# Patient Record
Sex: Female | Born: 1995 | Race: Black or African American | Hispanic: No | Marital: Married | Smoking: Current every day smoker
Health system: Southern US, Community
[De-identification: ages and names within clinical notes are randomized; demographics above are authoritative.]

## PROBLEM LIST (undated history)

## (undated) DIAGNOSIS — J40 Bronchitis, not specified as acute or chronic: Secondary | ICD-10-CM

## (undated) DIAGNOSIS — J45909 Unspecified asthma, uncomplicated: Secondary | ICD-10-CM

## (undated) DIAGNOSIS — L309 Dermatitis, unspecified: Secondary | ICD-10-CM

---
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
## 2016-03-24 ENCOUNTER — Emergency Department (HOSPITAL_COMMUNITY): Payer: Commercial Managed Care - HMO

## 2016-03-24 ENCOUNTER — Encounter (HOSPITAL_COMMUNITY): Payer: Self-pay

## 2016-03-24 ENCOUNTER — Emergency Department (HOSPITAL_COMMUNITY)
Admission: EM | Admit: 2016-03-24 | Discharge: 2016-03-24 | Disposition: A | Discharge status: Home / Self Care | Payer: Commercial Managed Care - HMO | Attending: Emergency Medicine | Admitting: Emergency Medicine

## 2016-03-24 DIAGNOSIS — R000 Tachycardia, unspecified: Secondary | ICD-10-CM | POA: Insufficient documentation

## 2016-03-24 DIAGNOSIS — R112 Nausea with vomiting, unspecified: Secondary | ICD-10-CM | POA: Diagnosis not present

## 2016-03-24 DIAGNOSIS — Z3202 Encounter for pregnancy test, result negative: Secondary | ICD-10-CM | POA: Insufficient documentation

## 2016-03-24 DIAGNOSIS — J45901 Unspecified asthma with (acute) exacerbation: Secondary | ICD-10-CM | POA: Insufficient documentation

## 2016-03-24 DIAGNOSIS — F17200 Nicotine dependence, unspecified, uncomplicated: Secondary | ICD-10-CM | POA: Diagnosis not present

## 2016-03-24 DIAGNOSIS — J159 Unspecified bacterial pneumonia: Secondary | ICD-10-CM | POA: Diagnosis not present

## 2016-03-24 DIAGNOSIS — Z79899 Other long term (current) drug therapy: Secondary | ICD-10-CM | POA: Diagnosis not present

## 2016-03-24 DIAGNOSIS — J189 Pneumonia, unspecified organism: Secondary | ICD-10-CM

## 2016-03-24 DIAGNOSIS — R05 Cough: Secondary | ICD-10-CM | POA: Diagnosis present

## 2016-03-24 DIAGNOSIS — R0602 Shortness of breath: Secondary | ICD-10-CM

## 2016-03-24 LAB — COMPREHENSIVE METABOLIC PANEL
ALT: 14 U/L (ref 14–54)
AST: 17 U/L (ref 15–41)
Albumin: 3.3 g/dL — ABNORMAL LOW (ref 3.5–5.0)
Alkaline Phosphatase: 82 U/L (ref 38–126)
Anion gap: 7 (ref 5–15)
BUN: <5 mg/dL — ABNORMAL LOW (ref 6–20)
CHLORIDE: 105 mmol/L (ref 101–111)
CO2: 25 mmol/L (ref 22–32)
Calcium: 9.3 mg/dL (ref 8.9–10.3)
Creatinine, Ser: 0.73 mg/dL (ref 0.44–1.00)
Glucose, Bld: 90 mg/dL (ref 65–99)
POTASSIUM: 3.5 mmol/L (ref 3.5–5.1)
Sodium: 137 mmol/L (ref 135–145)
Total Bilirubin: 0.1 mg/dL — ABNORMAL LOW (ref 0.3–1.2)
Total Protein: 8 g/dL (ref 6.5–8.1)

## 2016-03-24 LAB — URINALYSIS, ROUTINE W REFLEX MICROSCOPIC
Bilirubin Urine: NEGATIVE
GLUCOSE, UA: NEGATIVE mg/dL
Ketones, ur: NEGATIVE mg/dL
LEUKOCYTES UA: NEGATIVE
Nitrite: NEGATIVE
PH: 6 (ref 5.0–8.0)
PROTEIN: NEGATIVE mg/dL
Specific Gravity, Urine: 1.019 (ref 1.005–1.030)

## 2016-03-24 LAB — CBC
HEMATOCRIT: 33.8 % — AB (ref 36.0–46.0)
Hemoglobin: 9.3 g/dL — ABNORMAL LOW (ref 12.0–15.0)
MCH: 17.9 pg — ABNORMAL LOW (ref 26.0–34.0)
MCHC: 27.5 g/dL — ABNORMAL LOW (ref 30.0–36.0)
MCV: 64.9 fL — AB (ref 78.0–100.0)
Platelets: 443 10*3/uL — ABNORMAL HIGH (ref 150–400)
RBC: 5.21 MIL/uL — AB (ref 3.87–5.11)
RDW: 19.7 % — ABNORMAL HIGH (ref 11.5–15.5)
WBC: 11.8 10*3/uL — AB (ref 4.0–10.5)

## 2016-03-24 LAB — URINE MICROSCOPIC-ADD ON

## 2016-03-24 LAB — LIPASE, BLOOD: LIPASE: 18 U/L (ref 11–51)

## 2016-03-24 LAB — I-STAT BETA HCG BLOOD, ED (MC, WL, AP ONLY): I-stat hCG, quantitative: <5 m[IU]/mL (ref <5)

## 2016-03-24 MED ORDER — AZITHROMYCIN 250 MG PO TABS: 250.0000 mg | ORAL_TABLET | Freq: Every day | ORAL | Status: DC

## 2016-03-24 MED ORDER — ONDANSETRON 4 MG PO TBDP
4.0000 mg | ORAL_TABLET | Freq: Once | ORAL | Status: AC
  Administered 2016-03-24: 4 mg via ORAL
  Filled 2016-03-24: qty 1

## 2016-03-24 MED ORDER — ONDANSETRON 4 MG PO TBDP: 4.0000 mg | ORAL_TABLET | Freq: Three times a day (TID) | ORAL | Status: DC | PRN

## 2016-03-24 MED ORDER — LIDOCAINE HCL (PF) 1 % IJ SOLN
INTRAMUSCULAR | Status: AC
  Filled 2016-03-24: qty 5

## 2016-03-24 MED ORDER — DEXTROSE 5 % IV SOLN
1.0000 g | Freq: Once | INTRAVENOUS | Status: DC
  Filled 2016-03-24: qty 10

## 2016-03-24 MED ORDER — ONDANSETRON HCL 4 MG/2ML IJ SOLN
4.0000 mg | Freq: Once | INTRAMUSCULAR | Status: DC
  Filled 2016-03-24: qty 2

## 2016-03-24 MED ORDER — CEFTRIAXONE SODIUM 1 G IJ SOLR
1.0000 g | Freq: Once | INTRAMUSCULAR | Status: AC
  Administered 2016-03-24: 1 g via INTRAMUSCULAR
  Filled 2016-03-24: qty 10

## 2016-03-24 MED ORDER — SODIUM CHLORIDE 0.9 % IV BOLUS (SEPSIS): 1000.0000 mL | Freq: Once | INTRAVENOUS | Status: DC

## 2016-03-24 NOTE — ED Notes (Addendum)
Patient here with increased cough and using inhaler more frequently with body aches x 2 days.  Vomited x 2 following cough. No distress. Also complains of right lower abdominal pain

## 2016-03-24 NOTE — ED Notes (Signed)
IV Team at bedside 

## 2016-03-24 NOTE — ED Notes (Signed)
IV Team unsuccessful at gaining access. Reported this to PA, who agreed to change orders.

## 2016-03-24 NOTE — Discharge Instructions (Signed)
Community-Acquired Pneumonia, Adult Pneumonia is an infection of the lungs. One type of pneumonia can happen while a person is in a hospital. A different type can happen when a person is not in a hospital (community-acquired pneumonia). It is easy for this kind to spread from person to person. It can spread to you if you breathe near an infected person who coughs or sneezes. Some symptoms include:  A dry cough.  A wet (productive) cough.  Fever.  Sweating.  Chest pain. HOME CARE  Take over-the-counter and prescription medicines only as told by your doctor.  Only take cough medicine if you are losing sleep.  If you were prescribed an antibiotic medicine, take it as told by your doctor. Do not stop taking the antibiotic even if you start to feel better.  Sleep with your head and neck raised (elevated). You can do this by putting a few pillows under your head, or you can sleep in a recliner.  Do not use tobacco products. These include cigarettes, chewing tobacco, and e-cigarettes. If you need help quitting, ask your doctor.  Drink enough water to keep your pee (urine) clear or pale yellow. A shot (vaccine) can help prevent pneumonia. Shots are often suggested for:  People older than 19 years of age.  People older than 19 years of age:  Who are having cancer treatment.  Who have long-term (chronic) lung disease.  Who have problems with their body's defense system (immune system). You may also prevent pneumonia if you take these actions:  Get the flu (influenza) shot every year.  Go to the dentist as often as told.  Wash your hands often. If soap and water are not available, use hand sanitizer. GET HELP IF:  You have a fever.  You lose sleep because your cough medicine does not help. GET HELP RIGHT AWAY IF:  You are short of breath and it gets worse.  You have more chest pain.  Your sickness gets worse. This is very serious if:  You are an older adult.  Your  body's defense system is weak.  You cough up blood.   This information is not intended to replace advice given to you by your health care provider. Make sure you discuss any questions you have with your health care provider.   Document Released: 03/28/2008 Document Revised: 07/01/2015 Document Reviewed: 02/04/2015 Elsevier Interactive Patient Education 2016 [employer] Use your inhaler as needed for the next several days than per routine  Make sure to take all the antibiotic

## 2016-03-24 NOTE — ED Notes (Signed)
Pt departed in NAD.  

## 2016-03-24 NOTE — ED Notes (Signed)
Gave pt water and graham crackers for PO challenge.

## 2016-03-24 NOTE — ED Provider Notes (Signed)
CSN: [id number]     Arrival date & time 03/24/16  1811 History   First MD Initiated Contact with Patient 03/24/16 2011     Chief Complaint  Patient presents with  . Cough  . Generalized Body Aches     (Consider location/radiation/quality/duration/timing/severity/associated sxs/prior Treatment) HPI Comments: This a morbidly obese African-American female with a history of intermittent asthma.  He states she hasn't used her inhaler in several months.  Yesterday.  Notice she was feeling short of breath and having "hot flashes".  She's been using her inhaler with little relief.  She also reports today having multiple episodes of vomiting.  Patient is a 20 y.o. female presenting with cough. The history is provided by the patient.  Cough Cough characteristics:  Non-productive and hacking Severity:  Moderate Onset quality:  Gradual Duration:  2 days Timing:  Intermittent Progression:  Worsening Chronicity:  New Smoker: yes   Context: not upper respiratory infection   Relieved by:  Nothing Worsened by:  Activity Ineffective treatments:  Beta-agonist inhaler Associated symptoms: fever, myalgias, shortness of breath and wheezing   Associated symptoms: no chest pain, no chills, no eye discharge and no rhinorrhea   Associated symptoms comment:  Nausea and vomiting Fever:    Timing:  Unable to specify   Temp source:  Subjective   Progression:  Unable to specify Myalgias:    Location:  Generalized Shortness of breath:    Severity:  Mild Wheezing:    Severity:  Mild   Timing:  Intermittent   Chronicity:  Recurrent   Past Medical History  Diagnosis Date  . Asthma    History reviewed. No pertinent past surgical history. Family History  Problem Relation Age of Onset  . Cancer Mother   . Diabetes Other    Social History  Substance Use Topics  . Smoking status: Current Every Day Smoker -- 0.25 packs/day  . Smokeless tobacco: Current User  . Alcohol Use: Yes     Comment: rarely    OB History    No data available     Review of Systems  Constitutional: Positive for fever. Negative for chills.  HENT: Negative for rhinorrhea.   Eyes: Negative for discharge.  Respiratory: Positive for cough, shortness of breath and wheezing.   Cardiovascular: Negative for chest pain.  Gastrointestinal: Positive for nausea and vomiting. Negative for abdominal pain, diarrhea and constipation.  Genitourinary: Negative for dysuria, frequency and vaginal discharge.  Musculoskeletal: Positive for myalgias.  All other systems reviewed and are negative.     Allergies  Review of patient's allergies indicates no known allergies.  Home Medications   Prior to Admission medications   Medication Sig Start Date End Date Taking? Authorizing Provider  albuterol (PROVENTIL HFA;VENTOLIN HFA) 108 (90 Base) MCG/ACT inhaler Inhale 1-2 puffs into the lungs every 6 (six) hours as needed for wheezing or shortness of breath. 11/17/15  Yes Sanjuana Kava, NP  azithromycin (ZITHROMAX Z-PAK) 250 MG tablet Take 1 tablet (250 mg total) by mouth daily. 03/24/16   Earley Favor, NP  docusate sodium (COLACE) 100 MG capsule Take 1 capsule (100 mg total) by mouth daily. (May purchase from over the counter at yr pharmacy): For constipation Patient not taking: Reported on 03/24/2016 11/17/15   Sanjuana Kava, NP  ferrous sulfate 325 (65 FE) MG tablet Take 1 tablet (325 mg total) by mouth 2 (two) times daily with a meal. For low Iron Patient not taking: Reported on 03/24/2016 11/17/15   Sanjuana Kava, NP  nicotine polacrilex (NICORETTE) 2 MG gum Take 1 each (2 mg total) by mouth as needed for smoking cessation. Patient not taking: Reported on 03/24/2016 11/17/15   Sanjuana KavaAgnes I Nwoko, NP  ondansetron (ZOFRAN ODT) 4 MG disintegrating tablet Take 1 tablet (4 mg total) by mouth every 8 (eight) hours as needed for nausea or vomiting. 03/24/16   Earley FavorGail Dupree Givler, NP   BP 152/70 mmHg  Pulse 129  Temp(Src) 99.5 F (37.5 C) (Oral)  Resp 22  Ht  5\' 4"  (1.626 m)  Wt 132.45 kg  BMI 50.10 kg/m2  SpO2 97%  LMP 03/17/2016 Physical Exam  Constitutional: She appears well-developed and well-nourished.  HENT:  Head: Normocephalic.  Mouth/Throat: Oropharynx is clear and moist.  Eyes: Pupils are equal, round, and reactive to light.  Neck: Normal range of motion.  Cardiovascular: Regular rhythm.  Tachycardia present.   Pulmonary/Chest: Effort normal. No respiratory distress. She has wheezes.  Abdominal: Soft. She exhibits no distension. There is no tenderness.  Musculoskeletal: Normal range of motion.  Neurological: She is alert.  Skin: Skin is warm.  Nursing note and vitals reviewed.   ED Course  Procedures (including critical care time) Labs Review Labs Reviewed  COMPREHENSIVE METABOLIC PANEL - Abnormal; Notable for the following:    BUN <5 (*)    Albumin 3.3 (*)    Total Bilirubin 0.1 (*)    All other components within normal limits  CBC - Abnormal; Notable for the following:    WBC 11.8 (*)    RBC 5.21 (*)    Hemoglobin 9.3 (*)    HCT 33.8 (*)    MCV 64.9 (*)    MCH 17.9 (*)    MCHC 27.5 (*)    RDW 19.7 (*)    Platelets 443 (*)    All other components within normal limits  URINALYSIS, ROUTINE W REFLEX MICROSCOPIC ([facility]) - Abnormal; Notable for the following:    Hgb urine dipstick MODERATE (*)    All other components within normal limits  URINE MICROSCOPIC-ADD ON - Abnormal; Notable for the following:    Squamous Epithelial / LPF 0-5 (*)    Bacteria, UA RARE (*)    All other components within normal limits  LIPASE, BLOOD  I-STAT BETA HCG BLOOD, ED ([facility], [facility], [facility] ONLY)    Imaging Review Dg Chest 2 View  03/24/2016  CLINICAL DATA:  Acute onset of productive cough, chest tightness and body aches. Headache and vomiting. Fever. Initial encounter. EXAM: CHEST  2 VIEW COMPARISON:  Chest radiograph performed 04/14/2015 FINDINGS: The lungs are well-aerated. Pulmonary vascularity is at the upper limits of normal.  Minimal left basilar opacity may reflect pneumonia, given the patient's symptoms. There is no evidence of pleural effusion or pneumothorax. The heart is normal in size; the mediastinal contour is within normal limits. No acute osseous abnormalities are seen. IMPRESSION: Minimal left basilar opacity may reflect pneumonia, given the patient's symptoms. Electronically Signed   By: Roanna RaiderJeffery  Chang M.D.   On: 03/24/2016 19:04   I have personally reviewed and evaluated these images and lab results as part of my medical decision-making.   EKG Interpretation None     Patient has been given IM Rocephin and ODT Zofran.  She is now tolerating fluids.  She's not having any more wheezing.  Chest x-ray shows she has a vague opacity.  She's been instructed to take azithromycin of 80 mg on day 1, 2 days 2 through 5, and follow-up with her primary care physician  MDM   Final diagnoses:  CAP (community acquired pneumonia)  Non-intractable vomiting with nausea, vomiting of unspecified type  Shortness of breath         Earley Favor, NP 03/24/16 2249  Gerhard Munch, MD 03/24/16 2337

## 2016-03-24 NOTE — ED Notes (Signed)
Unable to void at triage

## 2016-06-03 IMAGING — CR DG CHEST 2V
2 series · 2 of 2 positions shown · non-contrast
Comparison: December 09, 2013

CLINICAL DATA: Cough and nasal congestion; shortness of breath

EXAM:
CHEST  2 VIEW

[chest pa]
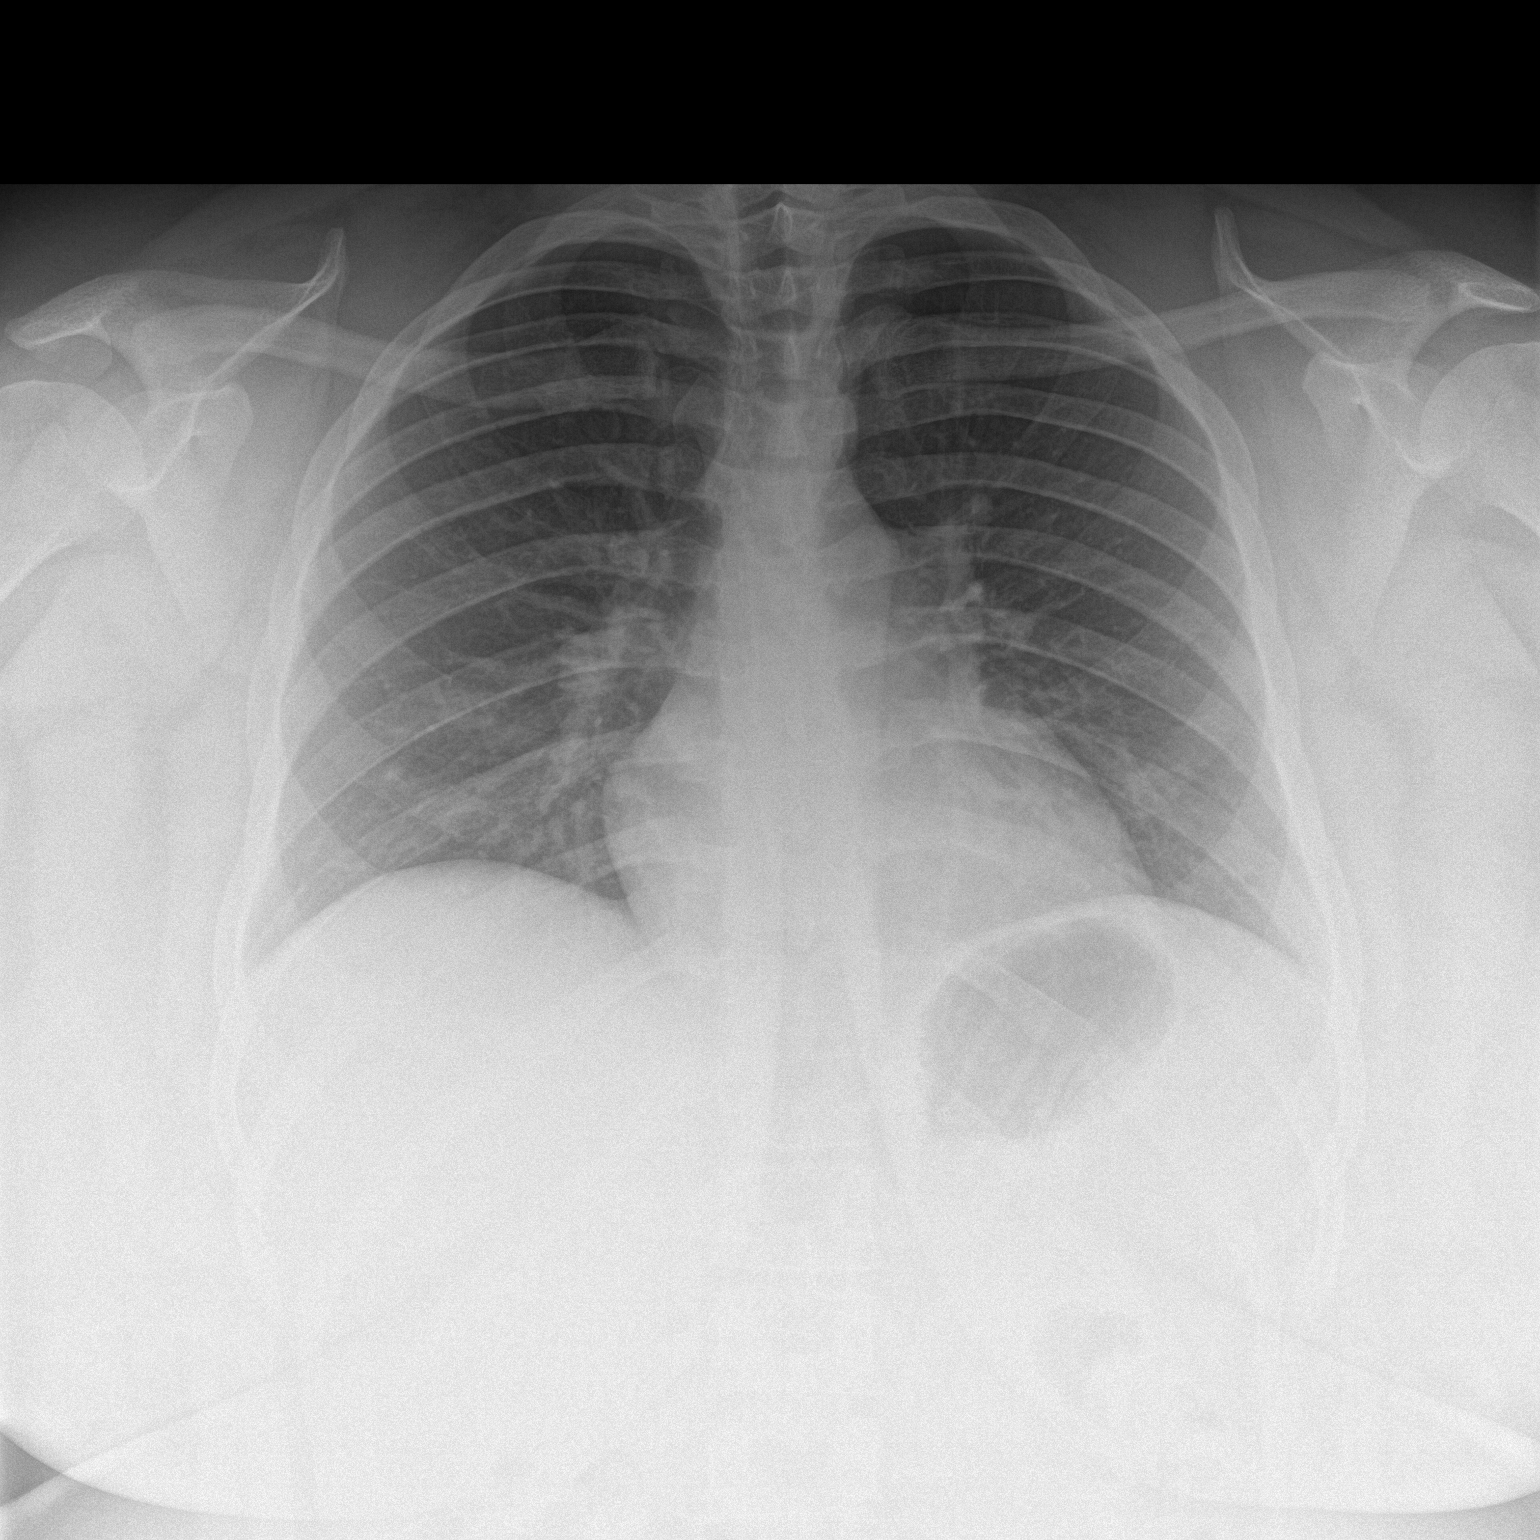

[chest lat]
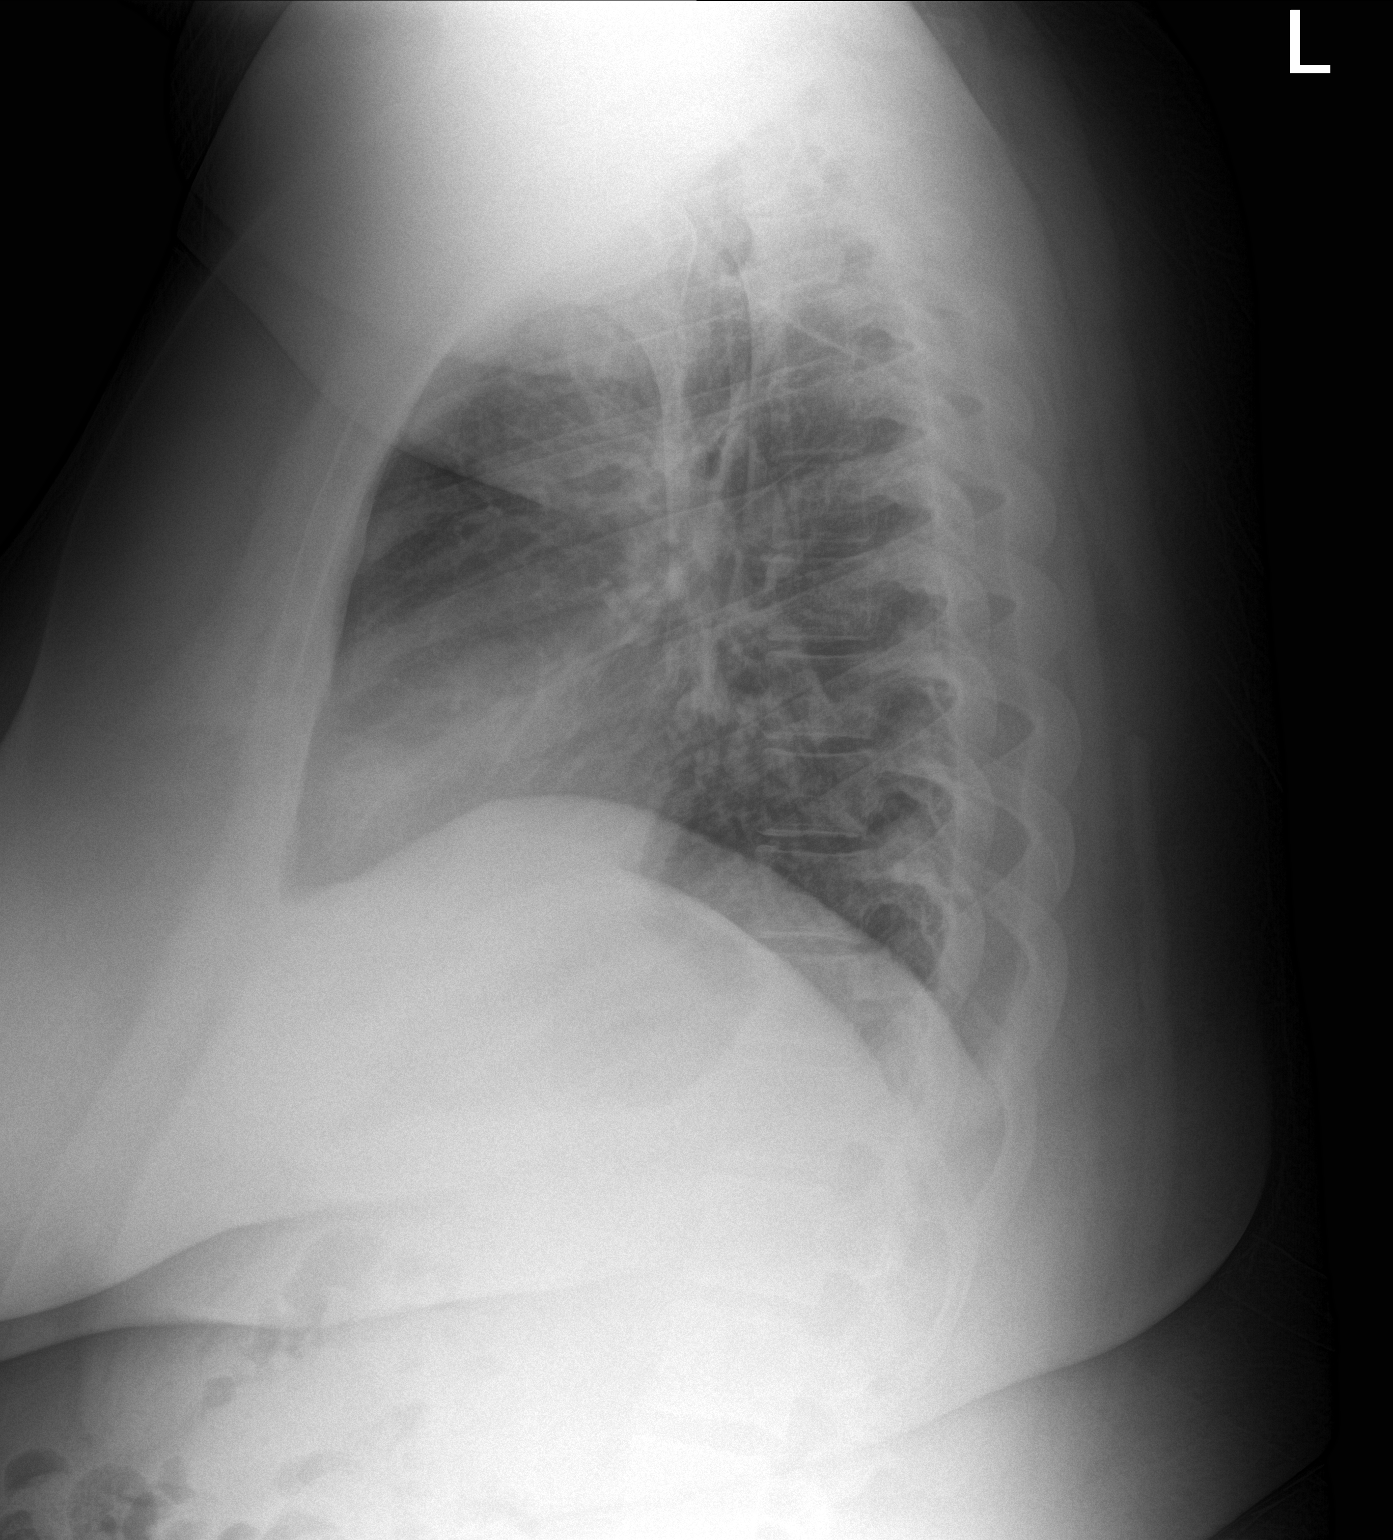

[2 of 2 positions shown; findings below may reference images not displayed]

FINDINGS: Lungs are clear. The heart size and pulmonary vascularity are
normal. No adenopathy. No bone lesions.
IMPRESSION: No edema or consolidation.

## 2016-07-03 ENCOUNTER — Ambulatory Visit (HOSPITAL_COMMUNITY)
Admission: EM | Admit: 2016-07-03 | Discharge: 2016-07-03 | Disposition: A | Discharge status: Home / Self Care | Payer: Commercial Managed Care - HMO | Attending: Family Medicine | Admitting: Family Medicine

## 2016-07-03 ENCOUNTER — Encounter (HOSPITAL_COMMUNITY): Payer: Self-pay | Admitting: Emergency Medicine

## 2016-07-03 DIAGNOSIS — R0982 Postnasal drip: Secondary | ICD-10-CM

## 2016-07-03 DIAGNOSIS — J4530 Mild persistent asthma, uncomplicated: Secondary | ICD-10-CM | POA: Diagnosis not present

## 2016-07-03 DIAGNOSIS — J9801 Acute bronchospasm: Secondary | ICD-10-CM | POA: Diagnosis not present

## 2016-07-03 HISTORY — DX: Bronchitis, not specified as acute or chronic: J40

## 2016-07-03 MED ORDER — DEXAMETHASONE SODIUM PHOSPHATE 10 MG/ML IJ SOLN
10.0000 mg | Freq: Once | INTRAMUSCULAR | Status: AC
  Administered 2016-07-03: 10 mg via INTRAMUSCULAR

## 2016-07-03 MED ORDER — SODIUM CHLORIDE 0.9 % IN NEBU
INHALATION_SOLUTION | RESPIRATORY_TRACT | Status: AC
  Filled 2016-07-03: qty 3

## 2016-07-03 MED ORDER — DEXAMETHASONE SODIUM PHOSPHATE 10 MG/ML IJ SOLN
INTRAMUSCULAR | Status: AC
  Filled 2016-07-03: qty 1

## 2016-07-03 MED ORDER — METHYLPREDNISOLONE ACETATE 80 MG/ML IJ SUSP
80.0000 mg | Freq: Once | INTRAMUSCULAR | Status: AC
  Administered 2016-07-03: 80 mg via INTRAMUSCULAR

## 2016-07-03 MED ORDER — IPRATROPIUM-ALBUTEROL 0.5-2.5 (3) MG/3ML IN SOLN
RESPIRATORY_TRACT | Status: AC
  Filled 2016-07-03: qty 3

## 2016-07-03 MED ORDER — PREDNISONE 20 MG PO TABS: ORAL_TABLET | ORAL | 0 refills | Status: DC

## 2016-07-03 MED ORDER — ALBUTEROL SULFATE HFA 108 (90 BASE) MCG/ACT IN AERS: 2.0000 | INHALATION_SPRAY | RESPIRATORY_TRACT | 0 refills | Status: DC | PRN

## 2016-07-03 MED ORDER — IPRATROPIUM-ALBUTEROL 0.5-2.5 (3) MG/3ML IN SOLN
3.0000 mL | Freq: Once | RESPIRATORY_TRACT | Status: AC
  Administered 2016-07-03: 3 mL via RESPIRATORY_TRACT

## 2016-07-03 MED ORDER — METHYLPREDNISOLONE ACETATE 80 MG/ML IJ SUSP
INTRAMUSCULAR | Status: AC
  Filled 2016-07-03: qty 1

## 2016-07-03 NOTE — ED Notes (Signed)
Treatment completed.

## 2016-07-03 NOTE — ED Triage Notes (Signed)
Patient reports a 3 day history of symptoms: productive cough-brown phlegm.  Chest soreness with cough, sore throat, throat drainage, states " feels like a sinus infection and bronchitis".  Patient reports general body aches.   Cough is worse at night.     Patient reports her boss sent her here for evaluation.  Patient will need work note.

## 2016-07-03 NOTE — ED Provider Notes (Signed)
CSN: [id number]     Arrival date & time 07/03/16  1520 History   First MD Initiated Contact with Patient 07/03/16 1654     Chief Complaint  Patient presents with  . URI   (Consider location/radiation/quality/duration/timing/severity/associated sxs/prior Treatment) 20 year old female 292 pounds with a history of asthma presents to the urgent care with complaints of sneezing, coughing, wheezing, sore throat, body aches.      Past Medical History:  Diagnosis Date  . Asthma   . Bronchitis    History reviewed. No pertinent surgical history. Family History  Problem Relation Age of Onset  . Cancer Mother   . Diabetes Other    Social History  Substance Use Topics  . Smoking status: Current Every Day Smoker    Packs/day: 0.25  . Smokeless tobacco: Current User  . Alcohol use Yes     Comment: rarely   OB History    No data available     Review of Systems  Constitutional: Negative for activity change, appetite change, chills and fever.  HENT: Positive for congestion, postnasal drip, rhinorrhea and sore throat. Negative for ear pain and facial swelling.   Eyes: Negative.   Respiratory: Positive for cough, shortness of breath and wheezing.   Cardiovascular: Negative.   Gastrointestinal: Negative.   Musculoskeletal: Negative for neck pain and neck stiffness.  Skin: Negative for pallor and rash.  Neurological: Negative.   All other systems reviewed and are negative.   Allergies  Review of patient's allergies indicates no known allergies.  Home Medications   Prior to Admission medications   Medication Sig Start Date End Date Taking? Authorizing Provider  Fexofenadine HCl (MUCINEX ALLERGY PO) Take by mouth.   Yes Historical Provider, MD  guaifenesin (ROBITUSSIN) 100 MG/5ML syrup Take 200 mg by mouth 3 (three) times daily as needed for cough.   Yes Historical Provider, MD  albuterol (PROVENTIL HFA;VENTOLIN HFA) 108 (90 Base) MCG/ACT inhaler Inhale 2 puffs into the lungs every  4 (four) hours as needed for wheezing or shortness of breath. 07/03/16   Hayden Rasmussenavid Marialy Urbanczyk, NP  azithromycin (ZITHROMAX Z-PAK) 250 MG tablet Take 1 tablet (250 mg total) by mouth daily. Patient not taking: Reported on 07/03/2016 03/24/16   Earley FavorGail Schulz, NP  docusate sodium (COLACE) 100 MG capsule Take 1 capsule (100 mg total) by mouth daily. (May purchase from over the counter at yr pharmacy): For constipation Patient not taking: Reported on 03/24/2016 11/17/15   Sanjuana KavaAgnes I Nwoko, NP  ferrous sulfate 325 (65 FE) MG tablet Take 1 tablet (325 mg total) by mouth 2 (two) times daily with a meal. For low Iron Patient not taking: Reported on 03/24/2016 11/17/15   Sanjuana KavaAgnes I Nwoko, NP  nicotine polacrilex (NICORETTE) 2 MG gum Take 1 each (2 mg total) by mouth as needed for smoking cessation. Patient not taking: Reported on 03/24/2016 11/17/15   Sanjuana KavaAgnes I Nwoko, NP  ondansetron (ZOFRAN ODT) 4 MG disintegrating tablet Take 1 tablet (4 mg total) by mouth every 8 (eight) hours as needed for nausea or vomiting. 03/24/16   Earley FavorGail Schulz, NP  predniSONE (DELTASONE) 20 MG tablet 3 Tabs PO Days 1-3, then 2 tabs PO Days 4-6, then 1 tab PO Day 7-9, then Half Tab PO Day 10-12. Start 07/04/16 07/03/16   Hayden Rasmussenavid Elkin Belfield, NP   Meds Ordered and Administered this Visit   Medications  ipratropium-albuterol (DUONEB) 0.5-2.5 (3) MG/3ML nebulizer solution 3 mL (3 mLs Nebulization Given 07/03/16 1735)  dexamethasone (DECADRON) injection 10 mg (10 mg Intramuscular  Given 07/03/16 1735)  methylPREDNISolone acetate (DEPO-MEDROL) injection 80 mg (80 mg Intramuscular Given 07/03/16 1735)    BP (!) 129/101 (BP Location: Right Wrist)   Pulse 105   Temp 98 F (36.7 C) (Oral)   Resp 18   LMP 06/25/2016 (Exact Date)   SpO2 99%  No data found.   Physical Exam  Constitutional: She is oriented to person, place, and time. She appears well-developed and well-nourished. No distress.  Morbidly obese 20 year old female sitting on the exam table in no acute distress.  Speaking in complete sentences.  HENT:  Head: Normocephalic and atraumatic.  Mouth/Throat: No oropharyngeal exudate.  Bilateral TMs are normal. Oropharynx with minor erythema, cobblestoning and moderate amount of clear PND.  Neck: Normal range of motion. Neck supple.  Cardiovascular: Normal rate, regular rhythm and normal heart sounds.   Pulmonary/Chest: Effort normal. No respiratory distress. She has wheezes. She exhibits tenderness.  Musculoskeletal: Normal range of motion. She exhibits no edema.  Lymphadenopathy:    She has no cervical adenopathy.  Neurological: She is alert and oriented to person, place, and time.  Skin: Skin is warm and dry. No rash noted.  Psychiatric: She has a normal mood and affect.  Nursing note and vitals reviewed.   Urgent Care Course   Clinical Course    Procedures (including critical care time)  Labs Review Labs Reviewed - No data to display  Imaging Review No results found.   Visual Acuity Review  Right Eye Distance:   Left Eye Distance:   Bilateral Distance:    Right Eye Near:   Left Eye Near:    Bilateral Near:         MDM   1. Cough due to bronchospasm   2. PND (post-nasal drip)   3. Asthma, mild persistent, uncomplicated    Post DuoNeb patient states she is breathing better. Auscultation reveals improved air movement, abatement of wheezes and no other adventitious sounds. Use the albuterol HFA inhaler 2 puffs every 4 hours as needed for cough and wheeze Start taking the prednisone taper medications tomorrow. Stop smoking. Recommend taking an antihistamine daily such as Allegra, Claritin or Zyrtec. Drink plenty of fluids stay well-hydrated. Call to obtain a primary care provider. Meds ordered this encounter  Medications  . Fexofenadine HCl (MUCINEX ALLERGY PO)    Sig: Take by mouth.  . guaifenesin (ROBITUSSIN) 100 MG/5ML syrup    Sig: Take 200 mg by mouth 3 (three) times daily as needed for cough.  Marland Kitchen  ipratropium-albuterol (DUONEB) 0.5-2.5 (3) MG/3ML nebulizer solution 3 mL  . dexamethasone (DECADRON) injection 10 mg  . methylPREDNISolone acetate (DEPO-MEDROL) injection 80 mg  . albuterol (PROVENTIL HFA;VENTOLIN HFA) 108 (90 Base) MCG/ACT inhaler    Sig: Inhale 2 puffs into the lungs every 4 (four) hours as needed for wheezing or shortness of breath.    Dispense:  1 Inhaler    Refill:  0    Order Specific Question:   Supervising Provider    Answer:   Elvina Sidle [[id number]]  . predniSONE (DELTASONE) 20 MG tablet    Sig: 3 Tabs PO Days 1-3, then 2 tabs PO Days 4-6, then 1 tab PO Day 7-9, then Half Tab PO Day 10-12. Start 07/04/16    Dispense:  20 tablet    Refill:  0    Order Specific Question:   Supervising Provider    Answer:   Maudry Diego, NP 07/03/16 1810

## 2016-07-03 NOTE — Discharge Instructions (Signed)
Use the albuterol HFA inhaler 2 puffs every 4 hours as needed for cough and wheeze Start taking the prednisone taper medications tomorrow. Stop smoking. Recommend taking an antihistamine daily such as Allegra, Claritin or Zyrtec. Drink plenty of fluids stay well-hydrated. Call to obtain a primary care provider.

## 2017-01-01 ENCOUNTER — Encounter (HOSPITAL_COMMUNITY): Payer: Self-pay

## 2017-01-01 ENCOUNTER — Emergency Department (HOSPITAL_COMMUNITY)
Admission: EM | Admit: 2017-01-01 | Discharge: 2017-01-01 | Disposition: A | Discharge status: Home / Self Care | Payer: Commercial Managed Care - HMO | Attending: Physician Assistant | Admitting: Physician Assistant

## 2017-01-01 ENCOUNTER — Emergency Department (HOSPITAL_COMMUNITY): Payer: Commercial Managed Care - HMO

## 2017-01-01 DIAGNOSIS — J45909 Unspecified asthma, uncomplicated: Secondary | ICD-10-CM | POA: Diagnosis not present

## 2017-01-01 DIAGNOSIS — Y9389 Activity, other specified: Secondary | ICD-10-CM | POA: Diagnosis not present

## 2017-01-01 DIAGNOSIS — F17210 Nicotine dependence, cigarettes, uncomplicated: Secondary | ICD-10-CM | POA: Diagnosis not present

## 2017-01-01 DIAGNOSIS — S92415A Nondisplaced fracture of proximal phalanx of left great toe, initial encounter for closed fracture: Secondary | ICD-10-CM | POA: Diagnosis not present

## 2017-01-01 DIAGNOSIS — Y929 Unspecified place or not applicable: Secondary | ICD-10-CM | POA: Diagnosis not present

## 2017-01-01 DIAGNOSIS — W208XXA Other cause of strike by thrown, projected or falling object, initial encounter: Secondary | ICD-10-CM | POA: Diagnosis not present

## 2017-01-01 DIAGNOSIS — Y999 Unspecified external cause status: Secondary | ICD-10-CM | POA: Diagnosis not present

## 2017-01-01 DIAGNOSIS — S99922A Unspecified injury of left foot, initial encounter: Secondary | ICD-10-CM | POA: Diagnosis present

## 2017-01-01 MED ORDER — HYDROCODONE-ACETAMINOPHEN 5-325 MG PO TABS
1.0000 | ORAL_TABLET | Freq: Once | ORAL | Status: AC
  Administered 2017-01-01: 1 via ORAL
  Filled 2017-01-01: qty 1

## 2017-01-01 NOTE — ED Triage Notes (Signed)
Pt endorses working this evening when a tow bar fell on her left foot and injured her left big toe. Pt continued walking but is now unable to move left bit toe. CMS intact.

## 2017-01-01 NOTE — ED Provider Notes (Signed)
[facility]-EMERGENCY DEPT Provider Note   CSN: [id number] Arrival date & time: 01/01/17  2108   By signing my name below, I, Soijett Blue, attest that this documentation has been prepared under the direction and in the presence of Roxy Horsemanobert Sameul Tagle, PA-C Electronically Signed: Soijett Blue, ED Scribe. 01/01/17. 11:00 PM.  History   Chief Complaint Chief Complaint  Patient presents with  . Toe Injury    HPI Kimberly Marquez is a 21 y.o. female who presents to the Emergency Department complaining of left great toe injury occurring PTA. Pt reports associated gait problem due to pain. Pt has not tried any medications for the relief of her symptoms. 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 She denies color change, wound, and any other symptoms.    The history is provided by the patient. No language interpreter was used.    Past Medical History:  Diagnosis Date  . Asthma   . Bronchitis     Patient Active Problem List   Diagnosis Date Noted  . Overdose   . 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 No pertinent surgical history.  OB History    No data available       Home Medications    Prior to Admission medications   Medication Sig Start Date End Date Taking? Authorizing Provider  albuterol (PROVENTIL HFA;VENTOLIN HFA) 108 (90 Base) MCG/ACT inhaler Inhale 2 puffs into the lungs every 4 (four) hours as needed for wheezing or shortness of breath. 07/03/16   Hayden Rasmussenavid Mabe, NP  Fexofenadine HCl [facility](MUCINEX ALLERGY PO) Take by mouth.    Historical Provider, MD  guaifenesin (ROBITUSSIN) 100 MG/5ML syrup Take 200 mg by mouth 3 (three) times daily as needed for cough.    Historical Provider, MD  nicotine polacrilex (NICORETTE) 2 MG gum Take 1 each (2 mg total) by mouth as needed for smoking cessation. Patient not taking: Reported on 03/24/2016 11/17/15   Sanjuana KavaAgnes I Nwoko, NP    ondansetron (ZOFRAN ODT) 4 MG disintegrating tablet Take 1 tablet (4 mg total) by mouth every 8 (eight) hours as needed for nausea or vomiting. 03/24/16   Earley FavorGail Schulz, NP  predniSONE (DELTASONE) 20 MG tablet 3 Tabs PO Days 1-3, then 2 tabs PO Days 4-6, then 1 tab PO Day 7-9, then Half Tab PO Day 10-12. Start 07/04/16 07/03/16   Hayden Rasmussenavid Mabe, NP    Family History Family History  Problem Relation Age of Onset  . Cancer Mother   . Diabetes Other     Social History Social History  Substance Use Topics  . Smoking status: Current Every Day Smoker    Packs/day: 0.25    Types: Cigarettes  . Smokeless tobacco: Current User  . Alcohol use Yes     Comment: rarely     Allergies   Patient has no known allergies.   Review of Systems Review of Systems  Musculoskeletal: Positive for arthralgias (left great toe) and gait problem (due to pain).  Skin: Negative for color change and wound.     Physical Exam Updated Vital Signs BP 132/79 (BP Location: Right Arm)   Pulse 102   Temp 98.6 F (37 C) (Oral)   Resp 18   Ht 5\' 5"  (1.651 m)   Wt 295 lb (133.8 kg)   LMP 12/27/2016 (Exact Date) Comment: verified BEFORE imaging  SpO2 100%   BMI 49.09 kg/m  Physical Exam Nursing note and vitals reviewed.  Constitutional: Pt appears well-developed and well-nourished. No distress.  HENT:  Head: Normocephalic and atraumatic.  Eyes: Conjunctivae are normal.  Neck: Normal range of motion.  Cardiovascular: Normal rate, regular rhythm. Intact distal pulses.   Capillary refill < 3 sec.  Pulmonary/Chest: Effort normal and breath sounds normal.  Musculoskeletal:  LLE Pt exhibits TTP over distal 1st phalanx. No obvious bony abnormality or deformity.   ROM: 4/5  Strength: 4/5  Neurological: Pt  is alert. Coordination normal.  Sensation: 5/5 Skin: Skin is warm and dry. Pt is not diaphoretic.  No evidence of open wound or skin tenting Psychiatric: Pt has a normal mood and affect.    ED  Treatments / Results  DIAGNOSTIC STUDIES: Oxygen Saturation is 100% on RA, nl by my interpretation.    COORDINATION OF CARE: 10:58 PM Discussed treatment plan with pt at bedside which includes left foot xray, post-op shoe and pt agreed to plan.   Radiology Dg Foot Complete Left  Result Date: 01/01/2017 CLINICAL DATA:  A tow bar fell onto the left foot this evening, injuring the great toe. Now unable to move the great toe. Pain and bruising at the base of the toe nail. EXAM: LEFT FOOT - COMPLETE 3+ VIEW COMPARISON:  09/15/2009 FINDINGS: Moderate diffuse soft tissue swelling of the left foot. Linear lucency obliquely oriented along the base of the distal phalanx of the left first toe laterally with extension focally to the joint surface. No focal bone lesion or bone destruction. Joint spaces are preserved. Lisfranc joint appears intact. IMPRESSION: Soft tissue swelling. Nondisplaced fracture across the base of the distal phalanx of the left first toe laterally with probable extension to the joint surface. Electronically Signed   By: Burman Nieves M.D.   On: 01/01/2017 21:56    Procedures Procedures (including critical care time)  Medications Ordered in ED Medications - No data to display   Initial Impression / Assessment and Plan / ED Course  I have reviewed the triage vital signs and the nursing notes.  Pertinent imaging results that were available during my care of the patient were reviewed by me and considered in my medical decision making (see chart for details).      Patient X-Ray positive for "Nondisplaced fracture across the base of the distal phalanx of the left first toe laterally with probable extension to the joint surface." Pt advised to follow up with PCP/orthopedics. Patient given post-op shoe while in ED, conservative therapy recommended and discussed. Patient will be discharged home & is agreeable with above plan. Returns precautions discussed. Pt appears safe for  discharge.  Final Clinical Impressions(s) / ED Diagnoses   Final diagnoses:  Closed nondisplaced fracture of proximal phalanx of left great toe, initial encounter    New Prescriptions New Prescriptions   No medications on file   I personally performed the services described in this documentation, which was scribed in my presence. The recorded information has been reviewed and is accurate.       Roxy Horseman, PA-C 01/01/17 2346    Courteney Randall An, MD 01/06/17 1431

## 2017-05-14 IMAGING — CR DG CHEST 2V
2 series · 2 of 2 positions shown · non-contrast
Comparison: Chest radiograph performed 04/14/2015

CLINICAL DATA: Acute onset of productive cough, chest tightness and
body aches. Headache and vomiting. Fever. Initial encounter.

EXAM:
CHEST  2 VIEW

[chest pa]
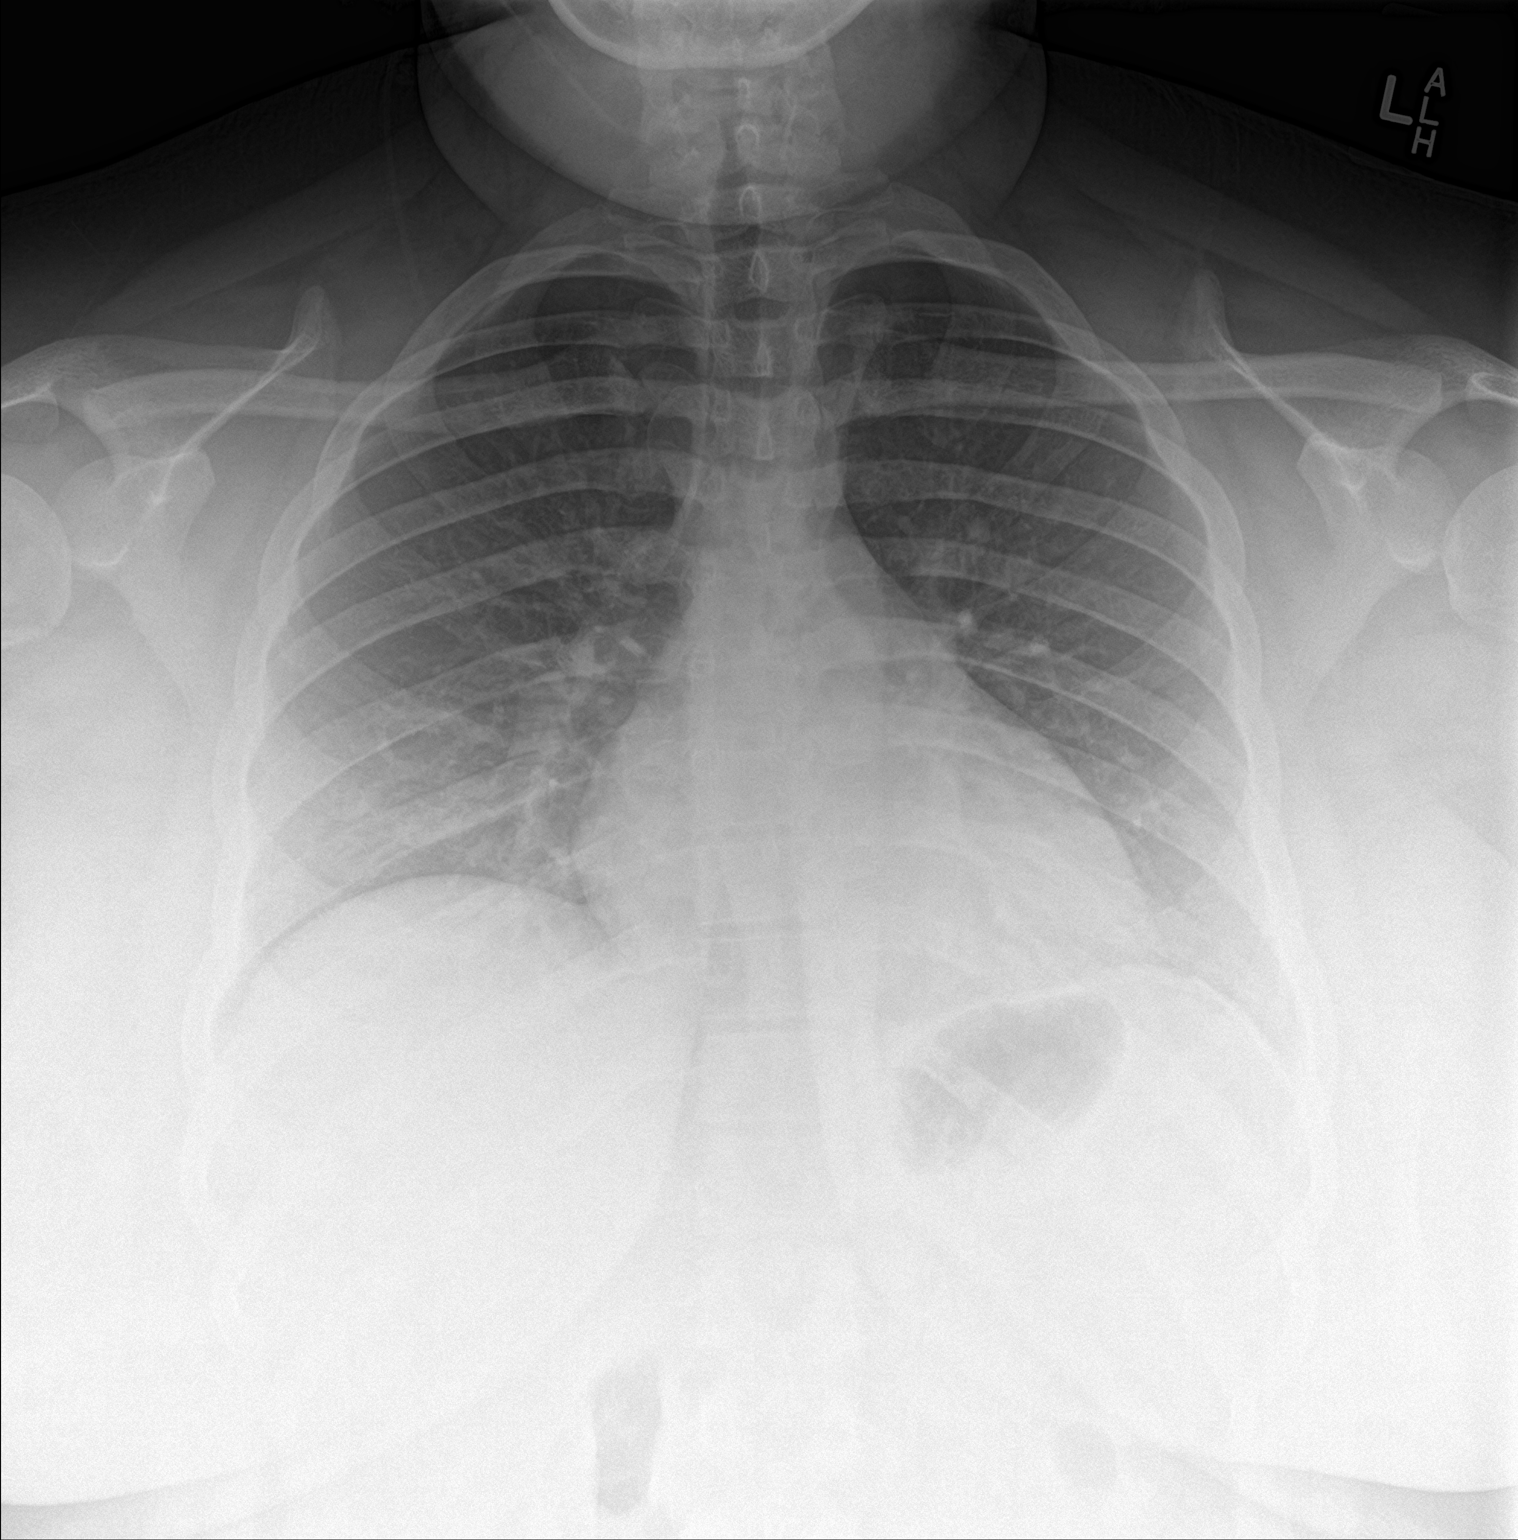

[chest lat]
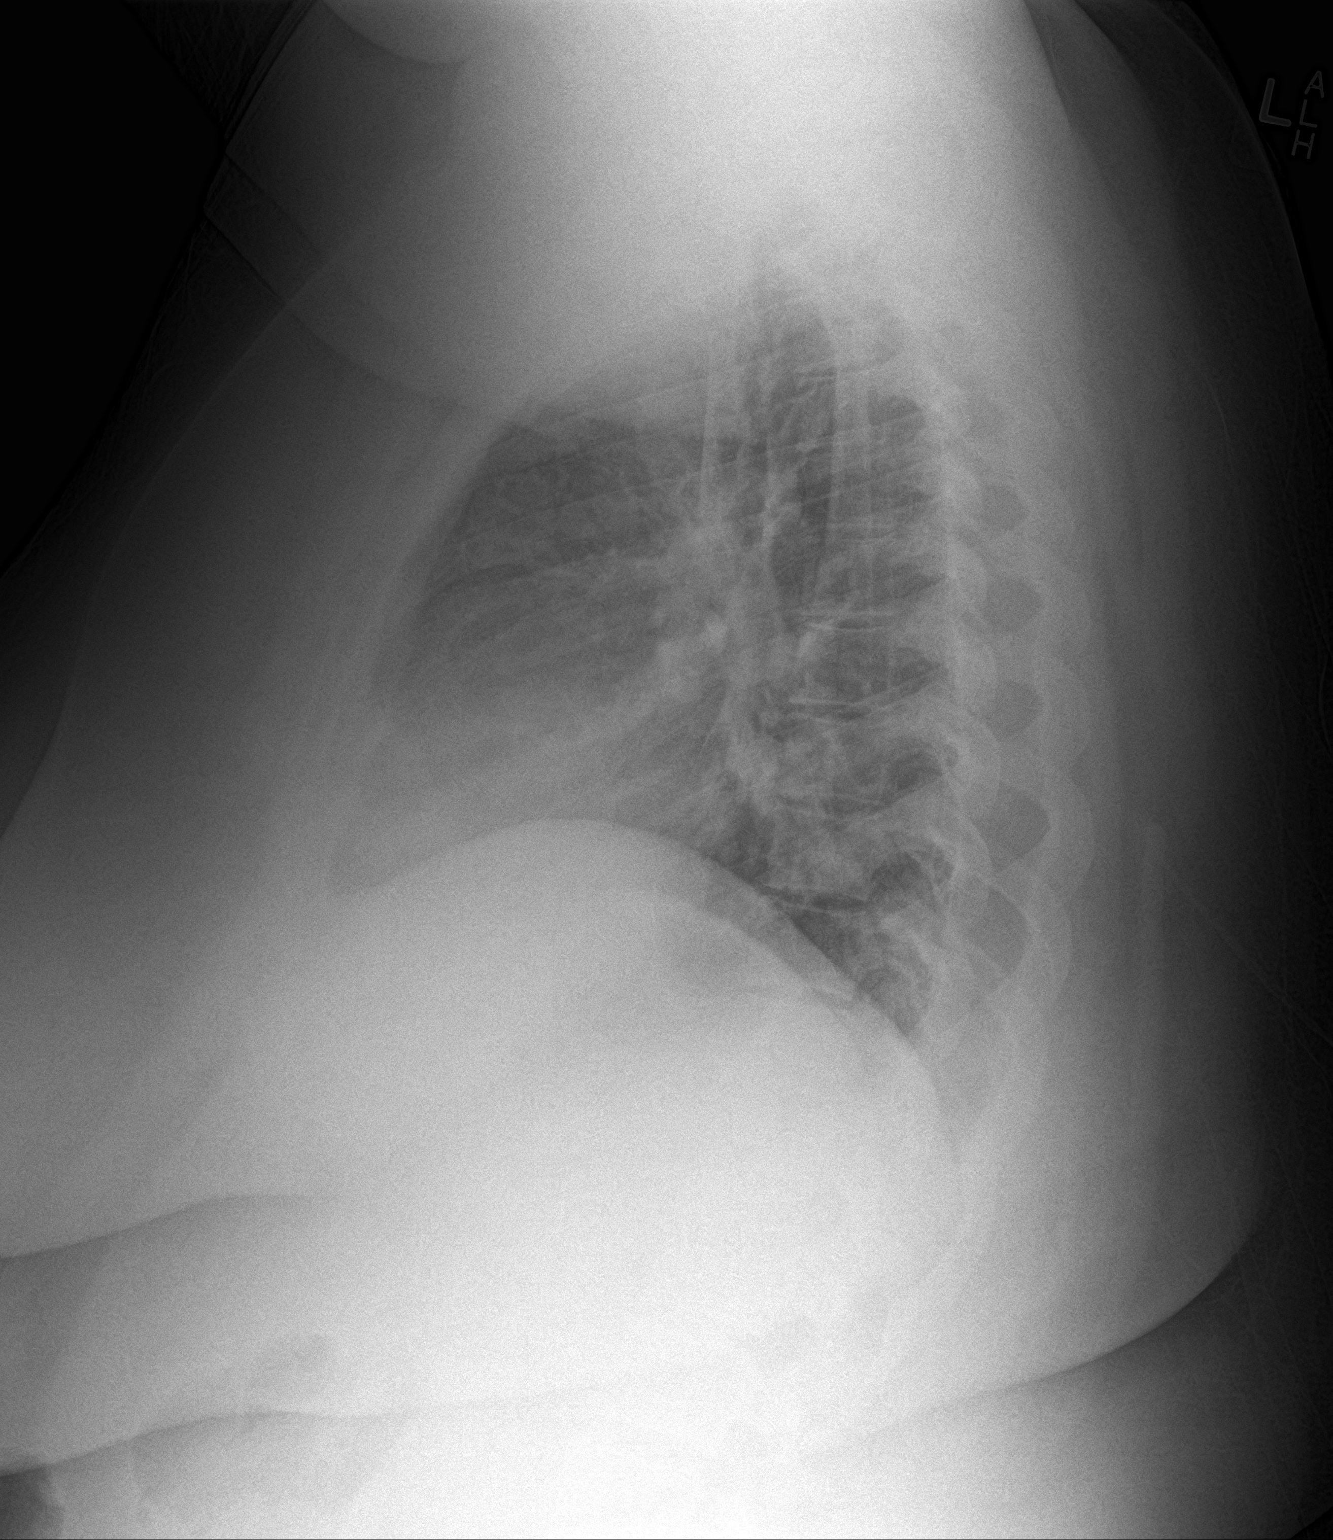

[2 of 2 positions shown; findings below may reference images not displayed]

FINDINGS: The lungs are well-aerated. Pulmonary vascularity is at the upper
limits of normal. Minimal left basilar opacity may reflect
pneumonia, given the patient's symptoms. There is no evidence of
pleural effusion or pneumothorax.

The heart is normal in size; the mediastinal contour is within
normal limits. No acute osseous abnormalities are seen.
IMPRESSION: Minimal left basilar opacity may reflect pneumonia, given the
patient's symptoms.

## 2017-06-28 ENCOUNTER — Encounter (HOSPITAL_COMMUNITY): Payer: Self-pay | Admitting: *Deleted

## 2017-06-28 ENCOUNTER — Ambulatory Visit (HOSPITAL_COMMUNITY)
Admission: EM | Admit: 2017-06-28 | Discharge: 2017-06-28 | Disposition: A | Discharge status: Home / Self Care | Payer: 59 | Attending: Family Medicine | Admitting: Family Medicine

## 2017-06-28 DIAGNOSIS — L209 Atopic dermatitis, unspecified: Secondary | ICD-10-CM

## 2017-06-28 MED ORDER — CETIRIZINE HCL 10 MG PO TABS: 10.0000 mg | ORAL_TABLET | Freq: Every day | ORAL | 0 refills | Status: DC

## 2017-06-28 MED ORDER — HYDROCORTISONE VALERATE 0.2 % EX OINT: 1.0000 "application " | TOPICAL_OINTMENT | Freq: Two times a day (BID) | CUTANEOUS | 0 refills | Status: DC

## 2017-06-28 NOTE — ED Triage Notes (Signed)
Patient with noted rash x 3 days to anterior aspect of bilateral forearms, patient reports it is itchy and is spreading.

## 2017-06-28 NOTE — ED Provider Notes (Signed)
[facility]    CSN: [id number] Arrival date & time: 06/28/17  1350     History   Chief Complaint Chief Complaint  Patient presents with  . Rash    HPI Kimberly Marquez is a 21 y.o. female.   21 year old female comes in for 3 day history of rash on bilateral forearm. She states it is itchy in nature, and has spread since onset. She denies any new exposure to the skin. Has been applying otc cream without relief, she thinks there is hydrocortisone in it, but is unsure. She denies any pain, spreading erythema, increased warmth. Denies fever, chills, night sweats. She states she had a history of eczema as a child. Denies any lotion/skin products she usually uses.       Past Medical History:  Diagnosis Date  . Asthma   . Bronchitis     Patient Active Problem List   Diagnosis Date Noted  . Overdose   . 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 No pertinent surgical history.  OB History    No data available       Home Medications    Prior to Admission medications   Medication Sig Start Date End Date Taking? Authorizing Provider  cetirizine (ZYRTEC ALLERGY) 10 MG tablet Take 1 tablet (10 mg total) by mouth daily. 06/28/17   Cathie HoopsYu, Berthe Oley V, PA-C  hydrocortisone valerate ointment (WESTCORT) 0.2 % Apply 1 application topically 2 (two) times daily. 06/28/17   Belinda FisherYu, Lamoine Magallon V, PA-C    Family History Family History  Problem Relation Age of Onset  . Cancer Mother   . Diabetes Other     Social History Social History  Substance Use Topics  . Smoking status: Current Every Day Smoker    Packs/day: 0.25    Types: Cigarettes  . Smokeless tobacco: Current User  . Alcohol use Yes     Comment: rarely     Allergies   Patient has no known allergies.   Review of Systems Review of Systems  Reason unable to perform ROS: See HPI as above.     Physical Exam Triage Vital Signs ED Triage Vitals  Enc Vitals Group     BP      Pulse    Resp      Temp      Temp src      SpO2      Weight      Height      Head Circumference      Peak Flow      Pain Score      Pain Loc      Pain Edu?      Excl. in GC?    No data found.   Updated Vital Signs There were no vitals taken for this visit.   Physical Exam  Constitutional: She is oriented to person, place, and time. She appears well-developed and well-nourished. No distress.  HENT:  Head: Normocephalic and atraumatic.  Eyes: Pupils are equal, round, and reactive to light. Conjunctivae are normal.  Neurological: She is alert and oriented to person, place, and time.  Skin:  See picture below.         UC Treatments / Results  Labs (all labs ordered are listed, but only abnormal results are displayed) Labs Reviewed - No data to display  EKG  EKG Interpretation None       Radiology No results found.  Procedures Procedures (including critical care time)  Medications Ordered in UC Medications - No data to display   Initial Impression / Assessment and Plan / UC Course  I have reviewed the triage vital signs and the nursing notes.  Pertinent labs & imaging results that were available during my care of the patient were reviewed by me and considered in my medical decision making (see chart for details).    Start zyrtec for itchiness, westcort for rash. Patient to monitor for new exposures and aggravating/alleviating factors. Follow up as scheduled with PCP if symptoms do not resolve/improve. Return precautions given.   Final Clinical Impressions(s) / UC Diagnoses   Final diagnoses:  Atopic dermatitis, unspecified type    New Prescriptions New Prescriptions   CETIRIZINE (ZYRTEC ALLERGY) 10 MG TABLET    Take 1 tablet (10 mg total) by mouth daily.   HYDROCORTISONE VALERATE OINTMENT (WESTCORT) 0.2 %    Apply 1 application topically 2 (two) times daily.       Belinda Fisher, PA-C 06/28/17 [phone]

## 2017-06-28 NOTE — Discharge Instructions (Signed)
Start zyrtec for itchiness. Start Westcort for rash. Unscented lotion for dry skin. Monitor for any new exposures, changes in detergent/body wash. Follow up with PCP if symptoms do not improve. If experiencing pain, spreading redness, increased warmth, follow up for reevaluation.

## 2017-07-12 ENCOUNTER — Emergency Department (HOSPITAL_COMMUNITY)
Admission: EM | Admit: 2017-07-12 | Discharge: 2017-07-12 | Disposition: A | Discharge status: Home / Self Care | Payer: 59 | Attending: Emergency Medicine | Admitting: Emergency Medicine

## 2017-07-12 ENCOUNTER — Encounter (HOSPITAL_COMMUNITY): Payer: Self-pay | Admitting: Emergency Medicine

## 2017-07-12 DIAGNOSIS — F17210 Nicotine dependence, cigarettes, uncomplicated: Secondary | ICD-10-CM | POA: Diagnosis not present

## 2017-07-12 DIAGNOSIS — B9789 Other viral agents as the cause of diseases classified elsewhere: Secondary | ICD-10-CM | POA: Insufficient documentation

## 2017-07-12 DIAGNOSIS — J029 Acute pharyngitis, unspecified: Secondary | ICD-10-CM | POA: Diagnosis present

## 2017-07-12 DIAGNOSIS — J45909 Unspecified asthma, uncomplicated: Secondary | ICD-10-CM | POA: Insufficient documentation

## 2017-07-12 DIAGNOSIS — Z79899 Other long term (current) drug therapy: Secondary | ICD-10-CM | POA: Insufficient documentation

## 2017-07-12 DIAGNOSIS — H9201 Otalgia, right ear: Secondary | ICD-10-CM | POA: Insufficient documentation

## 2017-07-12 MED ORDER — IBUPROFEN 200 MG PO TABS
600.0000 mg | ORAL_TABLET | Freq: Once | ORAL | Status: AC
  Administered 2017-07-12: 600 mg via ORAL
  Filled 2017-07-12: qty 3

## 2017-07-12 NOTE — Discharge Instructions (Signed)
Please read instructions below.  You can take  of advil/ibuprofen every 6 hours as needed for sore throat or body aches.  Drink plenty of water.  Follow up with your primary care provider as needed if symptoms persist.  Return to the ER for difficulty swallowing liquids, difficulty breathing, or new or worsening symptoms.

## 2017-07-12 NOTE — ED Provider Notes (Signed)
[facility] Provider Note   CSN: [id number] Arrival date & time: 07/12/17  1040     History   Chief Complaint Chief Complaint  Patient presents with  . Otalgia  . Jaw Pain    HPI Kimberly Marquez is a 21 y.o. female w OMHx asthma, depression, presenting to ED for acute onset of persistent right ear pain that began 2 days ago. Patient reports pain is worse with laying on her right side as well as turning her head to the left. Reports moderate relief with Tylenol. States pain is associated with sore throat and right-sided neck pain. She denies difficulty breathing or swallowing, difficulty opening her mouth, dental pain, fever/chills, cough, congestion, difficulty hearing, or any other complaints today.  The history is provided by the patient.    Past Medical History:  Diagnosis Date  . Asthma   . Bronchitis     Patient Active Problem List   Diagnosis Date Noted  . Overdose   . 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 No pertinent surgical history.  OB History    No data available       Home Medications    Prior to Admission medications   Medication Sig Start Date End Date Taking? Authorizing Provider  cetirizine (ZYRTEC ALLERGY) 10 MG tablet Take 1 tablet (10 mg total) by mouth daily. 06/28/17   Cathie Hoops, Amy V, PA-C  hydrocortisone valerate ointment (WESTCORT) 0.2 % Apply 1 application topically 2 (two) times daily. 06/28/17   Belinda Fisher, PA-C    Family History Family History  Problem Relation Age of Onset  . Cancer Mother   . Diabetes Other     Social History Social History  Substance Use Topics  . Smoking status: Current Every Day Smoker    Packs/day: 0.25    Types: Cigarettes  . Smokeless tobacco: Current User  . Alcohol use Yes     Comment: rarely     Allergies   Patient has no known allergies.   Review of Systems Review of Systems  Constitutional: Negative for chills and fever.  HENT: Positive for ear pain  and sore throat. Negative for congestion, dental problem, ear discharge, trouble swallowing and voice change.   Respiratory: Negative for cough and shortness of breath.   Neurological: Negative for headaches.     Physical Exam Updated Vital Signs BP (!) 153/97 (BP Location: Right Arm)   Pulse (!) 102   Temp 99.4 F (37.4 C) (Oral)   Resp 18   Ht  (1.626 m)   Wt 124.7 kg (275 lb)   LMP 06/28/2017   SpO2 99%   BMI 47.20 kg/m   Physical Exam  Constitutional: She appears well-developed and well-nourished. No distress.  Morbidly obese, well-appearing, tolerating secretions.  HENT:  Head: Normocephalic and atraumatic.  Right Ear: Hearing, tympanic membrane, external ear and ear canal normal. No mastoid tenderness. No middle ear effusion.  Left Ear: Hearing, tympanic membrane, external ear and ear canal normal. No mastoid tenderness.  No middle ear effusion.  Nose: Nose normal.  Mouth/Throat: Uvula is midline. No trismus in the jaw. Normal dentition. No dental abscesses, uvula swelling or dental caries. No posterior oropharyngeal edema or tonsillar abscesses. No tonsillar exudate.  Oropharynx mildly erythematous, without uvula swelling or trismus. No exudates.  Eyes: Conjunctivae are normal.  Neck: Normal range of motion. Neck supple. No tracheal deviation present.  Cardiovascular: Regular rhythm, normal heart sounds and intact distal pulses.   Slightly  tachycardic  Pulmonary/Chest: Effort normal and breath sounds normal. No stridor. No respiratory distress.  Lymphadenopathy:    She has cervical adenopathy (Mild right anterior cervical adenopathy.).  Psychiatric: She has a normal mood and affect. Her behavior is normal.  Nursing note and vitals reviewed.    ED Treatments / Results  Labs (all labs ordered are listed, but only abnormal results are displayed) Labs Reviewed - No data to display  EKG  EKG Interpretation None       Radiology No results  found.  Procedures Procedures (including critical care time)  Medications Ordered in ED Medications  ibuprofen (ADVIL,MOTRIN) tablet 600 mg (600 mg Oral Given 07/12/17 1134)     Initial Impression / Assessment and Plan / ED Course  I have reviewed the triage vital signs and the nursing notes.  Pertinent labs & imaging results that were available during my care of the patient were reviewed by me and considered in my medical decision making (see chart for details).      Patients symptoms are consistent with pharyngitis, likely viral etiology. Patient is afebrile, tolerating secretions. Presentation not concerning for PTA or infxn spread to soft tissue. No difficulty breathing or swallowing. No trismus or uvula deviation. Discussed that antibiotics are not indicated for viral infections. Pt will be discharged with symptomatic treatment. Strict return precautions given. Pt is hemodynamically stable and in NAD prior to discharge.  Discussed results, findings, treatment and follow up. Patient advised of return precautions. Patient verbalized understanding and agreed with plan.  Final Clinical Impressions(s) / ED Diagnoses   Final diagnoses:  Viral pharyngitis    New Prescriptions New Prescriptions   No medications on file     Russo, [place] N, PA-C 07/12/17 1141    Russo, [place] N, PA-C 07/12/17 1142    Lorre Nick, MD 07/12/17 1546

## 2017-07-12 NOTE — ED Notes (Signed)
Bed: WTR5 Expected date:  Expected time:  Means of arrival:  Comments: 

## 2017-07-12 NOTE — ED Triage Notes (Signed)
Patient c/o right ear pain and right sided jaw pain esp when patient turns head to the left.  Patient reports pain has been on going for couple days.

## 2017-10-28 ENCOUNTER — Other Ambulatory Visit: Payer: Self-pay

## 2017-10-28 ENCOUNTER — Encounter (HOSPITAL_COMMUNITY): Payer: Self-pay | Admitting: Emergency Medicine

## 2017-10-28 DIAGNOSIS — R05 Cough: Secondary | ICD-10-CM | POA: Diagnosis present

## 2017-10-28 DIAGNOSIS — J111 Influenza due to unidentified influenza virus with other respiratory manifestations: Secondary | ICD-10-CM | POA: Insufficient documentation

## 2017-10-28 DIAGNOSIS — F17210 Nicotine dependence, cigarettes, uncomplicated: Secondary | ICD-10-CM | POA: Insufficient documentation

## 2017-10-28 DIAGNOSIS — J45909 Unspecified asthma, uncomplicated: Secondary | ICD-10-CM | POA: Diagnosis not present

## 2017-10-28 DIAGNOSIS — Z79899 Other long term (current) drug therapy: Secondary | ICD-10-CM | POA: Insufficient documentation

## 2017-10-28 NOTE — ED Triage Notes (Addendum)
Pt reports having body aches and feeling hot for the last day and has been taking dayquil last dose at 1900.  Pt also reports eczema flair and has not had prescription cream.

## 2017-10-29 ENCOUNTER — Emergency Department (HOSPITAL_COMMUNITY)
Admission: EM | Admit: 2017-10-29 | Discharge: 2017-10-29 | Disposition: A | Discharge status: Home / Self Care | Payer: 59 | Attending: Emergency Medicine | Admitting: Emergency Medicine

## 2017-10-29 DIAGNOSIS — J111 Influenza due to unidentified influenza virus with other respiratory manifestations: Secondary | ICD-10-CM

## 2017-10-29 DIAGNOSIS — R69 Illness, unspecified: Secondary | ICD-10-CM

## 2017-10-29 NOTE — ED Provider Notes (Signed)
[facility]EMERGENCY DEPT Provider Note   CSN: [id number] Arrival date & time: 10/28/17  2051     History   Chief Complaint Chief Complaint  Patient presents with  . flu like symptoms    HPI [name] is a 22 y.o. female.  The history is provided by the patient.  Cough  This is a new problem. The current episode started yesterday. The problem occurs hourly. The cough is non-productive. Associated symptoms include chills, sweats and myalgias. Pertinent negatives include no chest pain, no sore throat and no shortness of breath.  Patient reports onset of cough, sneezing, body aches yesterday No chest pain no shortness of breath   Past Medical History:  Diagnosis Date  . Asthma   . Bronchitis     Patient Active Problem List   Diagnosis Date Noted  . Overdose   . 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 No pertinent surgical history.  OB History    No data available       Home Medications    Prior to Admission medications   Medication Sig Start Date End Date Taking? Authorizing Provider  cetirizine (ZYRTEC ALLERGY) 10 MG tablet Take 1 tablet (10 mg total) by mouth daily. 06/28/17   Cathie Hoops, Amy V, PA-C  hydrocortisone valerate ointment (WESTCORT) 0.2 % Apply 1 application topically 2 (two) times daily. 06/28/17   Belinda Fisher, PA-C    Family History Family History  Problem Relation Age of Onset  . Cancer Mother   . Diabetes Other     Social History Social History   Tobacco Use  . Smoking status: Current Every Day Smoker    Packs/day: 0.25    Types: Cigarettes  . Smokeless tobacco: Current User  Substance Use Topics  . Alcohol use: Yes    Comment: rarely  . Drug use: No     Allergies   Patient has no known allergies.   Review of Systems Review of Systems  Constitutional: Positive for chills.  HENT: Negative for sore throat.   Respiratory: Positive for cough. Negative for shortness of breath.     Cardiovascular: Negative for chest pain.  Musculoskeletal: Positive for myalgias.     Physical Exam Updated Vital Signs BP (!) 160/81 (BP Location: Left Arm)   Pulse (!) 101   Temp 98.8 F (37.1 C) (Oral)   Resp 18   Ht 1.626 m (5\' 4" )   Wt 129.3 kg (285 lb)   LMP 10/26/2017   SpO2 99%   BMI 48.92 kg/m   Physical Exam CONSTITUTIONAL: Well developed/well nourished HEAD: Normocephalic/atraumatic EYES: EOMI/PERRL ENMT: Mucous membranes moist NECK: supple no meningeal signs CV: S1/S2 noted, no murmurs/rubs/gallops noted LUNGS: Lungs are clear to auscultation bilaterally, no apparent distress ABDOMEN: soft, obese NEURO: Pt is awake/alert/appropriate, moves all extremitiesx4.  No facial droop.   EXTREMITIES: full ROM SKIN: warm, color normal PSYCH: no abnormalities of mood noted, alert and oriented to situation   ED Treatments / Results  Labs (all labs ordered are listed, but only abnormal results are displayed) Labs Reviewed - No data to display  EKG  EKG Interpretation None       Radiology No results found.  Procedures Procedures (including critical care time)  Medications Ordered in ED Medications - No data to display   Initial Impression / Assessment and Plan / ED Course  I have reviewed the triage vital signs and the nursing notes.      Patient well-appearing,  likely flulike illness. Lung sounds are clear, no hypoxia No need for further testing, will discharge home  Final Clinical Impressions(s) / ED Diagnoses   Final diagnoses:  Influenza-like illness    ED Discharge Orders    None       Zadie RhineWickline, Barbera Perritt, MD 10/29/17 [phone]

## 2018-02-07 ENCOUNTER — Ambulatory Visit (HOSPITAL_COMMUNITY)
Admission: EM | Admit: 2018-02-07 | Discharge: 2018-02-07 | Disposition: A | Discharge status: Home / Self Care | Payer: 59 | Attending: Family Medicine | Admitting: Family Medicine

## 2018-02-07 ENCOUNTER — Encounter (HOSPITAL_COMMUNITY): Payer: Self-pay | Admitting: Family Medicine

## 2018-02-07 DIAGNOSIS — L309 Dermatitis, unspecified: Secondary | ICD-10-CM

## 2018-02-07 DIAGNOSIS — L209 Atopic dermatitis, unspecified: Secondary | ICD-10-CM

## 2018-02-07 MED ORDER — TRIAMCINOLONE ACETONIDE 0.025 % EX OINT: 1.0000 "application " | TOPICAL_OINTMENT | Freq: Two times a day (BID) | CUTANEOUS | 0 refills | Status: DC

## 2018-02-07 NOTE — ED Triage Notes (Signed)
Pt here for severe eczema flare.

## 2018-02-07 NOTE — Discharge Instructions (Signed)
Use OTC benadryl at night time for symptomatic relief Avoid hot showers Use triamcinolone as directed and to completion Dermatology appointment in May Return or go to ER if you have any new or worsening symptoms

## 2018-02-07 NOTE — ED Provider Notes (Addendum)
[facility]    CSN: [id number] Arrival date & time: 02/07/18  1730     History   Chief Complaint Chief Complaint  Patient presents with  . Eczema    HPI Kimberly AntiguaSharee A Stegall is a 22 y.o. female that complains of skin rash.  She has had the rash for the past year.  She denies a precipitating event.  She localizes the rash right and left forearms .  She describes it as itchy and constant.  She has tried hydrocortisone without relief.  Her symptoms are worse at night and after taking hot showers.  She admits to similar symptoms in the past that improved with medication,unsure what medication was called.   Appointment with dermatology next month.  HPI  Past Medical History:  Diagnosis Date  . Asthma   . Bronchitis     Patient Active Problem List   Diagnosis Date Noted  . Overdose   . 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 No pertinent surgical history.  OB History   None      Home Medications    Prior to Admission medications   Medication Sig Start Date End Date Taking? Authorizing Provider  cetirizine (ZYRTEC ALLERGY) 10 MG tablet Take 1 tablet (10 mg total) by mouth daily. 06/28/17   Cathie HoopsYu, Amy V, PA-C  hydrocortisone valerate ointment (WESTCORT) 0.2 % Apply 1 application topically 2 (two) times daily. 06/28/17   Belinda FisherYu, Amy V, PA-C    Family History Family History  Problem Relation Age of Onset  . Cancer Mother   . Diabetes Other     Social History Social History   Tobacco Use  . Smoking status: Current Every Day Smoker    Packs/day: 0.25    Types: Cigarettes  . Smokeless tobacco: Current User  Substance Use Topics  . Alcohol use: Yes    Comment: rarely  . Drug use: No     Allergies   Patient has no known allergies.   Review of Systems Review of Systems  Constitutional: Negative for chills, fatigue and fever.  Respiratory: Negative for shortness of breath.   Cardiovascular: Negative for chest pain.  Skin:  Positive for rash.       Itching and dryness     Physical Exam Triage Vital Signs ED Triage Vitals  Enc Vitals Group     BP 02/07/18 1808 128/62     Pulse Rate 02/07/18 1808 (!) 105     Resp 02/07/18 1808 18     Temp --      Temp src --      SpO2 02/07/18 1808 100 %     Weight --      Height --      Head Circumference --      Peak Flow --      Pain Score 02/07/18 1807 0     Pain Loc --      Pain Edu? --      Excl. in GC? --    No data found.  Updated Vital Signs BP 128/62   Pulse (!) 105   Resp 18   SpO2 100%   Visual Acuity Right Eye Distance:   Left Eye Distance:   Bilateral Distance:    Right Eye Near:   Left Eye Near:    Bilateral Near:     Physical Exam  Constitutional: She is oriented to person, place, and time. She appears well-developed and well-nourished.  HENT:  Head: Normocephalic and  atraumatic.  Right Ear: External ear normal.  Left Ear: External ear normal.  Eyes: EOM are normal.  Neck: Normal range of motion.  Cardiovascular: Normal rate and regular rhythm. Exam reveals no friction rub.  No murmur heard. Pulmonary/Chest: Effort normal and breath sounds normal. She has no wheezes. She has no rales.  Musculoskeletal: Normal range of motion.  Ambulates from chair to exam table without difficulty  Neurological: She is alert and oriented to person, place, and time.  Skin: Skin is warm and dry. Rash noted.  Large dry, scaly oval patches of darkened papules appreciated on bilateral forearms and flexural areas of elbows without surrounding erythema or oozing.    Psychiatric: She has a normal mood and affect. Her behavior is normal. Judgment and thought content normal.     UC Treatments / Results  Labs (all labs ordered are listed, but only abnormal results are displayed) Labs Reviewed - No data to display  EKG None Radiology No results found.  Procedures Procedures (including critical care time)  Medications Ordered in UC Medications -  No data to display   Initial Impression / Assessment and Plan / UC Course  I have reviewed the triage vital signs and the nursing notes.  Pertinent labs & imaging results that were available during my care of the patient were reviewed by me and considered in my medical decision making (see chart for details).     Chronic history of eczema.  Patient symptoms and physical exam suggest eczema flare-up.  Patient also admits to hot shower use, stress, and not using daily moisturizers.  She was seen in September of 2018 and tried hydrocortisone without relief.  I will start her on triamcinolone and she was instructed to use OTC benadryl at night for nighttime symptoms.  She will avoid hot showers and use a daily moisturizer.  She has an appointment with dermatology next month for further evaluation and workup.  ER precautions given.    Final Clinical Impressions(s) / UC Diagnoses   Final diagnoses:  None    ED Discharge Orders    None       Controlled Substance Prescriptions [place] Controlled Substance Registry consulted? Not Applicable   Rennis Harding, PA-C 02/07/18 1911    Alvino Chapel [place], [place] 02/07/18 1911

## 2018-06-19 ENCOUNTER — Ambulatory Visit (HOSPITAL_COMMUNITY)
Admission: RE | Admit: 2018-06-19 | Discharge: 2018-06-19 | Disposition: A | Discharge status: Home / Self Care | Payer: 59 | Attending: Psychiatry | Admitting: Psychiatry

## 2018-06-19 DIAGNOSIS — Z7689 Persons encountering health services in other specified circumstances: Secondary | ICD-10-CM | POA: Diagnosis present

## 2018-06-19 NOTE — BH Assessment (Signed)
Pt shared she had to leave prior to assessment due to her wife needing to go to work. Pt reported "I just need someone to talk to". Pt denies SI. Pt denies HI and AH/VH. Pt assured this writer she would return 06/20/18.

## 2018-06-22 ENCOUNTER — Encounter (HOSPITAL_COMMUNITY): Payer: Self-pay | Admitting: Emergency Medicine

## 2018-06-22 ENCOUNTER — Other Ambulatory Visit: Payer: Self-pay

## 2018-06-22 ENCOUNTER — Emergency Department (HOSPITAL_COMMUNITY)
Admission: EM | Admit: 2018-06-22 | Discharge: 2018-06-22 | Disposition: A | Discharge status: Home / Self Care | Payer: 59 | Attending: Emergency Medicine | Admitting: Emergency Medicine

## 2018-06-22 DIAGNOSIS — J45909 Unspecified asthma, uncomplicated: Secondary | ICD-10-CM | POA: Diagnosis not present

## 2018-06-22 DIAGNOSIS — Z79899 Other long term (current) drug therapy: Secondary | ICD-10-CM | POA: Insufficient documentation

## 2018-06-22 DIAGNOSIS — F17210 Nicotine dependence, cigarettes, uncomplicated: Secondary | ICD-10-CM | POA: Insufficient documentation

## 2018-06-22 DIAGNOSIS — L258 Unspecified contact dermatitis due to other agents: Secondary | ICD-10-CM | POA: Diagnosis present

## 2018-06-22 DIAGNOSIS — L309 Dermatitis, unspecified: Secondary | ICD-10-CM

## 2018-06-22 HISTORY — DX: Dermatitis, unspecified: L30.9

## 2018-06-22 LAB — POC URINE PREG, ED: Preg Test, Ur: NEGATIVE

## 2018-06-22 MED ORDER — HYDROXYZINE HCL 25 MG PO TABS
25.0000 mg | ORAL_TABLET | Freq: Once | ORAL | Status: AC
Start: 2018-06-22 — End: 2018-06-22
  Administered 2018-06-22: 25 mg via ORAL
  Filled 2018-06-22: qty 1

## 2018-06-22 MED ORDER — PREDNISONE 20 MG PO TABS: 40.0000 mg | ORAL_TABLET | Freq: Every day | ORAL | 0 refills | Status: AC

## 2018-06-22 MED ORDER — HYDROXYZINE HCL 25 MG PO TABS: 25.0000 mg | ORAL_TABLET | Freq: Four times a day (QID) | ORAL | 0 refills | Status: DC

## 2018-06-22 NOTE — ED Notes (Signed)
Patient given discharge instructions and verbalized understanding.  Patient stable to discharge at this time.  Patient is alert and oriented to baseline.  No distressed noted at this time.  All belongings taken with the patient at discharge.   

## 2018-06-22 NOTE — Discharge Instructions (Signed)
Take prednisone as directed.  Use Vistaril as directed for itching.  Follow-up with [facility] to establish primary care.  Follow-up with dermatologist as directed.  Family department for any fever, worsening rash or any other worsening or concerning symptoms.

## 2018-06-22 NOTE — ED Notes (Signed)
Called for triage. Pt in restroom.

## 2018-06-22 NOTE — ED Provider Notes (Signed)
Patient placed in Quick Look pathway, seen and evaluated   Chief Complaint: eczema  HPI:   Kimberly AntiguaSharee A Marquez is a 22 y.o. female who presents to the ED for itching and rash that she reports is eczema. She has had eczema since she was a child. Patient reports taking benadryl without relief. Patient went to an Urgent Care about a month ago and was given Kenalog Cream but it has not helped.   ROS: skin: eczema  Physical Exam:  BP (!) 151/92 (BP Location: Right Wrist)   Pulse (!) 115   Temp 98.7 F (37.1 C) (Oral)   Resp 18   Ht 5\' 4"  (1.626 m)   LMP 06/08/2018   SpO2 100%   BMI 48.92 kg/m    Gen: No distress  Neuro: Awake and Alert  Skin: dry, scaly c/w eczema     Initiation of care has begun. The patient has been counseled on the process, plan, and necessity for staying for the completion/evaluation, and the remainder of the medical screening examination    Janne Napoleoneese, Hope M, NP 06/22/18 Anette Guarneri1920    Campos, Kevin, MD 06/23/18 [phone]

## 2018-06-22 NOTE — ED Notes (Signed)
Urine Pregnancy is negative.

## 2018-06-22 NOTE — ED Provider Notes (Addendum)
[facility] EMERGENCY DEPARTMENT Provider Note   CSN: [id number] Arrival date & time: 06/22/18  1909     History   Chief Complaint Chief Complaint  Patient presents with  . Eczema    HPI Kimberly Marquez is a 22 y.o. female possible history of asthma, eczema who presents for evaluation of progressive worsening itching and rash that is secondary to patient's eczema.  Patient reports a long-standing history of eczema.  She states that she has scheduled an appointment with dermatologist but is unable to see him until December of this month.  Patient reports she is continued to have worsening rash, particular to her bilateral upper extremities, legs.  Patient reports she is used triamcinolone cream and over-the-counter Benadryl with no improvement.  She reports she has not been able to sleep over the last few nights secondary to itching.  She states that this is consistent with her asthma.  She denies any fevers, oral lesions, difficulty breathing.  The history is provided by the patient.    Past Medical History:  Diagnosis Date  . Asthma   . Bronchitis   . Eczema     Patient Active Problem List   Diagnosis Date Noted  . Overdose   . 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 No pertinent surgical history.   OB History   None      Home Medications    Prior to Admission medications   Medication Sig Start Date End Date Taking? Authorizing Provider  cetirizine (ZYRTEC ALLERGY) 10 MG tablet Take 1 tablet (10 mg total) by mouth daily. 06/28/17   Cathie Hoops, Amy V, PA-C  hydrocortisone valerate ointment (WESTCORT) 0.2 % Apply 1 application topically 2 (two) times daily. 06/28/17   Cathie Hoops, Amy V, PA-C  hydrOXYzine (ATARAX/VISTARIL) 25 MG tablet Take 1 tablet (25 mg total) by mouth every 6 (six) hours. 06/22/18   Maxwell Caul, PA-C  predniSONE (DELTASONE) 20 MG tablet Take 2 tablets (40 mg total) by mouth daily for 4 days. 06/22/18 06/26/18   Maxwell Caul, PA-C  triamcinolone (KENALOG) 0.025 % ointment Apply 1 application topically 2 (two) times daily. Apply thin film to affected areas 2-4 times daily 02/07/18   Alvino Chapel [place], PA-C    Family History Family History  Problem Relation Age of Onset  . Cancer Mother   . Diabetes Other     Social History Social History   Tobacco Use  . Smoking status: Current Every Day Smoker    Packs/day: 0.25    Types: Cigarettes  . Smokeless tobacco: Current User  Substance Use Topics  . Alcohol use: Yes    Comment: rarely  . Drug use: No     Allergies   Patient has no known allergies.   Review of Systems Review of Systems  Constitutional: Negative for fever.  Skin: Positive for rash.  All other systems reviewed and are negative.    Physical Exam Updated Vital Signs BP (!) 153/86   Pulse 100   Temp 98.7 F (37.1 C) (Oral)   Resp 18   Ht 5\' 4"  (1.626 m)   LMP 06/08/2018   SpO2 100%   BMI 48.92 kg/m   Physical Exam  Constitutional: She appears well-developed and well-nourished.  HENT:  Head: Normocephalic and atraumatic.  Eyes: Conjunctivae and EOM are normal. Right eye exhibits no discharge. Left eye exhibits no discharge. No scleral icterus.  Pulmonary/Chest: Effort normal.  Neurological: She is alert.  Skin: Skin is  warm and dry.  Dry scaly patches noted to the anterior aspect of bilateral lower extremities, particular the flexor creases.  Additionally, patient with dry scaly patches noted to bilateral lower extremities, abdomen, chest, back.  No oral lesions.  No rash noted on palms of hands.  Psychiatric: She has a normal mood and affect. Her speech is normal and behavior is normal.  Nursing note and vitals reviewed.    ED Treatments / Results  Labs (all labs ordered are listed, but only abnormal results are displayed) Labs Reviewed  POC URINE PREG, ED    EKG None  Radiology No results found.  Procedures Procedures (including critical  care time)  Medications Ordered in ED Medications  hydrOXYzine (ATARAX/VISTARIL) tablet 25 mg (25 mg Oral Given 06/22/18 2121)     Initial Impression / Assessment and Plan / ED Course  I have reviewed the triage vital signs and the nursing notes.  Pertinent labs & imaging results that were available during my care of the patient were reviewed by me and considered in my medical decision making (see chart for details).     22 year old female presents for evaluation of progressively worsening itching rash.  Associated with eczema.  Is scheduled to see a dermatologist in December.  Reports using over-the-counter Benadryl and triamcinolone cream with no improvement.  Has not been able to sleep secondary to pain. Patient is afebrile, non-toxic appearing, sitting comfortably on examination table. Vital signs reviewed and stable.  On exam, she has dry scaly patches noted to bilateral upper and lower extremities, abdomen, chest, back.  Consistent with eczema.  No oral lesions.  Exam is not concerning for SJS, TENS, syphilis.  We will plan to treat patient with prednisone and Vistaril for symptomatic relief.  Encourage at home supportive care measures.  Patient given instructions to follow-up with [facility] to establish primary care. Patient had ample opportunity for questions and discussion. All patient's questions were answered with full understanding. Strict return precautions discussed. Patient expresses understanding and agreement to plan.   Final Clinical Impressions(s) / ED Diagnoses   Final diagnoses:  Eczema, unspecified type    ED Discharge Orders         Ordered    predniSONE (DELTASONE) 20 MG tablet  Daily     06/22/18 2109    hydrOXYzine (ATARAX/VISTARIL) 25 MG tablet  Every 6 hours     06/22/18 2109           Maxwell CaulLayden, Bridney Guadarrama A, PA-C 06/22/18 2115    Maxwell CaulLayden, Railynn Ballo A, PA-C 06/22/18 2202    Maia PlanLong, Joshua G, MD 06/23/18 [phone]

## 2018-06-22 NOTE — ED Triage Notes (Signed)
C/o eczema flare-up over the past couple weeks.  Denies changing soaps or detergents.

## 2018-07-05 ENCOUNTER — Other Ambulatory Visit: Payer: Self-pay

## 2018-07-05 ENCOUNTER — Emergency Department (HOSPITAL_COMMUNITY)
Admission: EM | Admit: 2018-07-05 | Discharge: 2018-07-05 | Disposition: A | Discharge status: Home / Self Care | Payer: 59 | Attending: Emergency Medicine | Admitting: Emergency Medicine

## 2018-07-05 ENCOUNTER — Encounter (HOSPITAL_COMMUNITY): Payer: Self-pay | Admitting: *Deleted

## 2018-07-05 DIAGNOSIS — F17210 Nicotine dependence, cigarettes, uncomplicated: Secondary | ICD-10-CM | POA: Insufficient documentation

## 2018-07-05 DIAGNOSIS — D649 Anemia, unspecified: Secondary | ICD-10-CM

## 2018-07-05 DIAGNOSIS — J45909 Unspecified asthma, uncomplicated: Secondary | ICD-10-CM | POA: Diagnosis not present

## 2018-07-05 DIAGNOSIS — Z79899 Other long term (current) drug therapy: Secondary | ICD-10-CM | POA: Insufficient documentation

## 2018-07-05 DIAGNOSIS — R42 Dizziness and giddiness: Secondary | ICD-10-CM | POA: Diagnosis present

## 2018-07-05 LAB — BASIC METABOLIC PANEL
Anion gap: 9 (ref 5–15)
BUN: 5 mg/dL — ABNORMAL LOW (ref 6–20)
CALCIUM: 9.5 mg/dL (ref 8.9–10.3)
CHLORIDE: 107 mmol/L (ref 98–111)
CO2: 26 mmol/L (ref 22–32)
CREATININE: 0.73 mg/dL (ref 0.44–1.00)
GFR calc non Af Amer: >60 mL/min (ref >60)
Glucose, Bld: 100 mg/dL — ABNORMAL HIGH (ref 70–99)
Potassium: 3.7 mmol/L (ref 3.5–5.1)
Sodium: 142 mmol/L (ref 135–145)

## 2018-07-05 LAB — CBC
HCT: 36.3 % (ref 36.0–46.0)
Hemoglobin: 10.4 g/dL — ABNORMAL LOW (ref 12.0–15.0)
MCH: 20 pg — ABNORMAL LOW (ref 26.0–34.0)
MCHC: 28.7 g/dL — ABNORMAL LOW (ref 30.0–36.0)
MCV: 69.7 fL — ABNORMAL LOW (ref 78.0–100.0)
PLATELETS: 431 10*3/uL — AB (ref 150–400)
RBC: 5.21 MIL/uL — AB (ref 3.87–5.11)
RDW: 18.6 % — ABNORMAL HIGH (ref 11.5–15.5)
WBC: 12.7 10*3/uL — AB (ref 4.0–10.5)

## 2018-07-05 LAB — URINALYSIS, ROUTINE W REFLEX MICROSCOPIC
Bilirubin Urine: NEGATIVE
Glucose, UA: NEGATIVE mg/dL
KETONES UR: NEGATIVE mg/dL
Nitrite: NEGATIVE
PH: 7 (ref 5.0–8.0)
Protein, ur: NEGATIVE mg/dL
Specific Gravity, Urine: 1.016 (ref 1.005–1.030)

## 2018-07-05 LAB — RAPID URINE DRUG SCREEN, HOSP PERFORMED
Amphetamines: NOT DETECTED
BENZODIAZEPINES: NOT DETECTED
Barbiturates: NOT DETECTED
Cocaine: NOT DETECTED
Opiates: NOT DETECTED
Tetrahydrocannabinol: NOT DETECTED

## 2018-07-05 LAB — I-STAT BETA HCG BLOOD, ED (MC, WL, AP ONLY): I-stat hCG, quantitative: <5 m[IU]/mL (ref <5)

## 2018-07-05 LAB — CBG MONITORING, ED
GLUCOSE-CAPILLARY: 78 mg/dL (ref 70–99)
Glucose-Capillary: 64 mg/dL — ABNORMAL LOW (ref 70–99)

## 2018-07-05 LAB — I-STAT TROPONIN, ED: Troponin i, poc: 0 ng/mL (ref 0.00–0.08)

## 2018-07-05 MED ORDER — SODIUM CHLORIDE 0.9 % IV BOLUS
1000.0000 mL | Freq: Once | INTRAVENOUS | Status: AC
  Administered 2018-07-05: 1000 mL via INTRAVENOUS

## 2018-07-05 NOTE — ED Notes (Signed)
Informed by Tech that pt's CBG was 64.  Pt given orange juice.

## 2018-07-05 NOTE — ED Notes (Signed)
Discharge instructions reviewed with pt. Pt verbalized understanding. Pt to follow up with PCP. PIV removed. Pt ambulatory to waiting room.  

## 2018-07-05 NOTE — Discharge Instructions (Addendum)
You were seen in the emergency department for a brief episode of lightheadedness. I apologize for the delay in obtaining your lab work.  However, work-up today is overall reassuring.  Your hemoglobin is slightly low.  I recommend taking over-the-counter iron with a stool softener and MiraLAX and having your hemoglobin rechecked.  I suspect your symptoms may have been from a brief episode of low sugar levels.  Make sure that you are eating small, healthy meals every 3-4 hours.  Stay well-hydrated.  Monitor your symptoms.  Return to the ER if symptoms worsen, if you develop severe headaches, vision disturbances, chest pain or shortness of breath on exertion, palpitations, slurred speech difficulty communicating, one-sided weakness or heaviness to her extremities.  Follow-up with your primary care doctor as scheduled for reevaluation and further discussion of today's conditions and visit.

## 2018-07-05 NOTE — ED Notes (Signed)
This RN looked for vein for lab draw and saw nothing to stick.  This nurse attempted to call phlebotomy.

## 2018-07-05 NOTE — ED Provider Notes (Signed)
[facility]-EMERGENCY DEPT Provider Note   CSN: [id number] Arrival date & time: 07/05/18  1336     History   Chief Complaint Chief Complaint  Patient presents with  . Dizziness    HPI Kimberly Marquez is a 22 y.o. female with history of asthma, tobacco use, eczema, anxiety is here for evaluation of lightheadedness that happened approximately around 1230 today.  Patient states she had just taken a shower, walked to get a cold glass of water when she had sudden onset lightheadedness while she was standing up, she had a hard time standing because she felt leg weakness and lightheadedness with movement, nausea, difficulty talking and moving.  Spouse who was present said patient looked like she wanted to say something but could not get the words out, patient told her she could not move either.  States her body felt heavy. Wife told her to blink once for no, twice for yes so pt could answer her questions.  Episode lasted approximately 20 to 30 minutes and patient currently feels much better but continues to report dry mouth, cold, thirst and feeling tired.  She had no associated headache, vision changes, chest pain, palpitations, cough, shortness of breath, vomiting, abdominal pain, numbness or heaviness to extremities.  Spouse denies noticing facial droop, one-sided muscle weakness or extremity weakness.  Patient admits that she has not eaten in the last 24 hours and is requesting something to eat and drink.  CBG on arrival was 64.  Occasional ETOh use. Marijuana use last few weeks ago. Reports long h/o anxiety, untreated. Has had difficulty sleeping for several weeks. Never had panic attacks or similar symptoms.   HPI  Past Medical History:  Diagnosis Date  . Asthma   . Bronchitis   . Eczema     Patient Active Problem List   Diagnosis Date Noted  . Overdose   . 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 No pertinent surgical  history.   OB History   None      Home Medications    Prior to Admission medications   Medication Sig Start Date End Date Taking? Authorizing Provider  Multiple Vitamins-Minerals (MULTIVITAL PO) Take 1 tablet by mouth daily.   Yes [provider]  cetirizine (ZYRTEC ALLERGY) 10 MG tablet Take 1 tablet (10 mg total) by mouth daily. Patient not taking: Reported on 07/05/2018 06/28/17   Belinda Fisher, PA-C  hydrocortisone valerate ointment (WESTCORT) 0.2 % Apply 1 application topically 2 (two) times daily. Patient not taking: Reported on 07/05/2018 06/28/17   Belinda Fisher, PA-C  hydrOXYzine (ATARAX/VISTARIL) 25 MG tablet Take 1 tablet (25 mg total) by mouth every 6 (six) hours. Patient not taking: Reported on 07/05/2018 06/22/18   Graciella Freer A, PA-C  triamcinolone (KENALOG) 0.025 % ointment Apply 1 application topically 2 (two) times daily. Apply thin film to affected areas 2-4 times daily Patient not taking: Reported on 07/05/2018 02/07/18   Rennis Harding, PA-C    Family History Family History  Problem Relation Age of Onset  . Cancer Mother   . Diabetes Other     Social History Social History   Tobacco Use  . Smoking status: Current Every Day Smoker    Packs/day: 0.10    Types: Cigarettes  . Smokeless tobacco: Never Used  Substance Use Topics  . Alcohol use: Yes    Comment: rarely  . Drug use: No     Allergies   Patient has no known  allergies.   Review of Systems Review of Systems  Constitutional: Positive for fatigue.  Gastrointestinal: Positive for nausea.  Neurological: Positive for speech difficulty and light-headedness.  All other systems reviewed and are negative.    Physical Exam Updated Vital Signs BP (!) 150/103 (BP Location: Left Arm)   Pulse (!) 118   Temp 98.7 F (37.1 C) (Oral)   Resp 18   Ht 5\' 4"  (1.626 m)   Wt (!) 139.7 kg   LMP 06/08/2018   SpO2 98%   BMI 52.87 kg/m   Physical Exam  Constitutional: She is oriented to person,  place, and time. She appears well-developed and well-nourished.  Non toxic  HENT:  Head: Normocephalic and atraumatic.  Nose: Nose normal.  Eyes: Pupils are equal, round, and reactive to light. Conjunctivae and EOM are normal.  Neck: Normal range of motion.  Cardiovascular: Normal rate and regular rhythm.  2+ radial and DP pulses bilaterally. No LE edema or calf tenderness.   Pulmonary/Chest: Effort normal.  Diminished lung sounds to lower lobes, difficult exam given body habitus. No wheezing, rhonchi. Normal WOB.   Abdominal: Soft. Bowel sounds are normal. There is no tenderness.  No G/R/R. No suprapubic or CVA tenderness. Negative Slavens's and McBurney's. Active BS to lower quadrants.   Musculoskeletal: Normal range of motion.  Neurological: She is alert and oriented to person, place, and time.  Alert and oriented to self, place, time and event.  Speech is fluent without obvious dysarthria or dysphasia. Strength 5/5 with hand grip and ankle F/E.   Sensation to light touch intact in hands and feet. Normal gait/No truncal sway. No pronator drift. No leg drop.  Normal finger-to-nose and finger tapping.  CN I and VIII not tested. CN II-XII grossly intact bilaterally.  No ankle clonus.   Skin: Skin is warm and dry. Capillary refill takes less than 2 seconds.  Psychiatric: She has a normal mood and affect. Her behavior is normal.  Nursing note and vitals reviewed.    ED Treatments / Results  Labs (all labs ordered are listed, but only abnormal results are displayed) Labs Reviewed  BASIC METABOLIC PANEL - Abnormal; Notable for the following components:      Result Value   Glucose, Bld 100 (*)    BUN 5 (*)    All other components within normal limits  CBC - Abnormal; Notable for the following components:   WBC 12.7 (*)    RBC 5.21 (*)    Hemoglobin 10.4 (*)    MCV 69.7 (*)    MCH 20.0 (*)    MCHC 28.7 (*)    RDW 18.6 (*)    Platelets 431 (*)    All other components within  normal limits  URINALYSIS, ROUTINE W REFLEX MICROSCOPIC - Abnormal; Notable for the following components:   Hgb urine dipstick LARGE (*)    Leukocytes, UA TRACE (*)    Bacteria, UA RARE (*)    All other components within normal limits  CBG MONITORING, ED - Abnormal; Notable for the following components:   Glucose-Capillary 64 (*)    All other components within normal limits  URINE CULTURE  RAPID URINE DRUG SCREEN, HOSP PERFORMED  I-STAT BETA HCG BLOOD, ED ([facility], [facility], [facility] ONLY)  CBG MONITORING, ED  I-STAT TROPONIN, ED    EKG EKG Interpretation  Date/Time:  Thursday July 05 2018 14:06:54 EDT Ventricular Rate:  95 PR Interval:    QRS Duration: 95 QT Interval:  354 QTC Calculation: 445 R  Axis:   80 Text Interpretation:  Sinus rhythm Borderline T abnormalities, anterior leads No significant change since last tracing Confirmed by Linwood Dibbles [phone]) on 07/05/2018 5:04:04 PM   Radiology No results found.  Procedures Procedures (including critical care time)  Medications Ordered in ED Medications  sodium chloride 0.9 % bolus 1,000 mL (0 mLs Intravenous Stopped 07/05/18 2316)     Initial Impression / Assessment and Plan / ED Course  I have reviewed the triage vital signs and the nursing notes.  Pertinent labs & imaging results that were available during my care of the patient were reviewed by me and considered in my medical decision making (see chart for details).  Clinical Course as of Jul 06 21  Thu Jul 05, 2018  1935 Hgb urine dipstick(!): LARGE [CG]  1935 Leukocytes, UA(!): TRACE [CG]  1935 WBC, UA: 6-10 [CG]  1935 Bacteria, UA(!): RARE [CG]  2050 Unfortunately unsuccessful IV and lab draws since arrival.  I attempted twice, multiple RNs failed attempts, IV team unsuccessful.  Charge RN attempting now. Considering femoral stick by Dr Lynelle Doctor. Apologized at length to patient regarding delay.  She has been very understanding and states she feels well, she is willing to  attempt another time.    [CG]  2158 WBC(!): 12.7 [CG]  2159 Hemoglobin(!): 10.4 [CG]    Clinical Course User Index [CG] Liberty Handy, PA-C    22 yo here for lightheadedness.  Associated with non-specific constellation of symptoms including speech/movement difficulty, nausea, leg weakness. Pt is obese but otherwise medically healthy. Her initial CBG 64 could explain hypoglycemic symptoms, she admitted to no food for more than 24 hours, long work hours.  Considered arrhythmia, peripheral vertigo, TIA/CVA however this is considered highly unlikely in this pt. Her neuro exam today is normal.  She is not having CP, SOB, calf pain or swelling and PE considered but unlikely. No estrogen use. Favoring hypoglycemic event, she has not eaten in over 24 hours. Will obtain screening labs, EKG, trop, UA.   0020: WBC 12.7, hgb 10.4.  Large hgb/trace leuks/rare bacteria in UA however no UTI symptoms, will send for culture.  EKG and trop negative. VSS.  Pt given IVF, food, and symptoms have not reoccurred.  Discussed mildly low hgb, recommended iron supplements/miralax/stool softener and re-check.  She is aware urine has been sent for culture to r/o infection.  She has appt with PCP on 9/26.  Feel she is adequate for dc.  Favoring hypoglycemia episode vs vertigo.  Discussed return precautions.  Final Clinical Impressions(s) / ED Diagnoses   Final diagnoses:  Lightheadedness  Low hemoglobin    ED Discharge Orders    None       Jerrell Mylar 07/06/18 Barbette Reichmann, MD 07/08/18 [phone]

## 2018-07-05 NOTE — ED Notes (Signed)
This writer looked in order to obtain labs on patient. Was not able to find a vein. Had another staff member look, was unable to find vein for labwork

## 2018-07-05 NOTE — ED Triage Notes (Signed)
Pt bib EMS and coming from home.  Pt was in the shower and started to feel "swimmy" and got out and sat on the toilet.  Then pt transferred to her be with the help of the wife.  Pt then seemed to go in and out while on the bed for about a minute.  On EMS arrival, pt was a/o x 4 and only reported dizziness and nausea. Pt denies any other symptoms to EMS.  Hx anxiety.

## 2018-07-07 LAB — URINE CULTURE

## 2018-10-04 ENCOUNTER — Other Ambulatory Visit: Payer: Self-pay

## 2018-10-04 ENCOUNTER — Encounter (HOSPITAL_COMMUNITY): Payer: Self-pay | Admitting: Emergency Medicine

## 2018-10-04 ENCOUNTER — Emergency Department (HOSPITAL_COMMUNITY)
Admission: EM | Admit: 2018-10-04 | Discharge: 2018-10-04 | Disposition: A | Discharge status: Home / Self Care | Payer: 59 | Attending: Emergency Medicine | Admitting: Emergency Medicine

## 2018-10-04 DIAGNOSIS — J029 Acute pharyngitis, unspecified: Secondary | ICD-10-CM | POA: Insufficient documentation

## 2018-10-04 DIAGNOSIS — B9789 Other viral agents as the cause of diseases classified elsewhere: Secondary | ICD-10-CM | POA: Diagnosis not present

## 2018-10-04 DIAGNOSIS — J45909 Unspecified asthma, uncomplicated: Secondary | ICD-10-CM | POA: Insufficient documentation

## 2018-10-04 DIAGNOSIS — F17210 Nicotine dependence, cigarettes, uncomplicated: Secondary | ICD-10-CM | POA: Insufficient documentation

## 2018-10-04 DIAGNOSIS — Z79899 Other long term (current) drug therapy: Secondary | ICD-10-CM | POA: Insufficient documentation

## 2018-10-04 LAB — GROUP A STREP BY PCR: Group A Strep by PCR: NOT DETECTED

## 2018-10-04 MED ORDER — IBUPROFEN 800 MG PO TABS
ORAL_TABLET | ORAL | Status: AC
  Filled 2018-10-04: qty 1

## 2018-10-04 NOTE — ED Notes (Signed)
Patient verbalizes understanding of discharge instructions. Opportunity for questioning and answers were provided. Armband removed by staff, pt discharged from ED.  

## 2018-10-04 NOTE — Discharge Instructions (Signed)
See downtime forms.

## 2018-10-04 NOTE — ED Triage Notes (Signed)
Pt c/o sore throat and bilateral ear pain x3 days.  

## 2018-10-04 NOTE — ED Provider Notes (Signed)
CHIEF COMPLAINT: Sore throat, ear pain  HPI: Patient is a 22 year old female with history of asthma, eczema, obesity who presents to the emergency department with complaints of sore throat, bilateral ear pain for the past 2 to 3 days.  Reports decreased oral intake secondary to sore throat.  No vomiting or diarrhea.  No fever.  Has been exposed to sick contacts.  Has not had an influenza vaccination.  ROS: See HPI Constitutional: no fever  Eyes: no drainage  ENT: no runny nose   Cardiovascular:  no chest pain  Resp: no SOB  GI: no vomiting GU: no dysuria Integumentary: no rash  Allergy: no hives  Musculoskeletal: no leg swelling  Neurological: no slurred speech ROS otherwise negative  PAST MEDICAL HISTORY/PAST SURGICAL HISTORY:  Past Medical History:  Diagnosis Date  . Asthma   . Bronchitis   . Eczema     MEDICATIONS:  Prior to Admission medications   Medication Sig Start Date End Date Taking? Authorizing Provider  cetirizine (ZYRTEC ALLERGY) 10 MG tablet Take 1 tablet (10 mg total) by mouth daily. Patient not taking: Reported on 07/05/2018 06/28/17   Belinda Fisher, PA-C  hydrocortisone valerate ointment (WESTCORT) 0.2 % Apply 1 application topically 2 (two) times daily. Patient not taking: Reported on 07/05/2018 06/28/17   Belinda Fisher, PA-C  hydrOXYzine (ATARAX/VISTARIL) 25 MG tablet Take 1 tablet (25 mg total) by mouth every 6 (six) hours. Patient not taking: Reported on 07/05/2018 06/22/18   Maxwell Caul, PA-C  Multiple Vitamins-Minerals (MULTIVITAL PO) Take 1 tablet by mouth daily.    [provider]  triamcinolone (KENALOG) 0.025 % ointment Apply 1 application topically 2 (two) times daily. Apply thin film to affected areas 2-4 times daily Patient not taking: Reported on 07/05/2018 02/07/18   Alvino Chapel [place], PA-C    ALLERGIES:  No Known Allergies  SOCIAL HISTORY:  Social History   Tobacco Use  . Smoking status: Current Every Day Smoker    Packs/day: 0.10   Types: Cigarettes  . Smokeless tobacco: Never Used  Substance Use Topics  . Alcohol use: Yes    Comment: rarely    FAMILY HISTORY: Family History  Problem Relation Age of Onset  . Cancer Mother   . Diabetes Other     EXAM: BP (!) 141/92 (BP Location: Right Arm)   Pulse (!) 107   Temp 99.1 F (37.3 C) (Oral)   Resp 20   SpO2 99%  CONSTITUTIONAL: Alert and oriented and responds appropriately to questions. Well-appearing; well-nourished HEAD: Normocephalic EYES: Conjunctivae clear, pupils appear equal, EOMI ENT: normal nose; moist mucous membranes; No pharyngeal erythema or petechiae, mild bilateral tonsillar hypertrophy without exudate, no uvular deviation, no unilateral swelling, no trismus or drooling, no muffled voice, normal phonation, no stridor, no dental caries present, no drainable dental abscess noted, no Ludwig's angina, tongue sits flat in the bottom of the mouth, no angioedema, no facial erythema or warmth, no facial swelling; no pain with movement of the neck.  TMs are clear bilaterally without erythema, purulence, bulging, perforation, effusion.  No cerumen impaction or sign of foreign body in the external auditory canal. No inflammation, erythema or drainage from the external auditory canal. No signs of mastoiditis. No pain with manipulation of the pinna bilaterally. NECK: Supple, no meningismus, no nuchal rigidity, no LAD  CARD: Regular and minimally tachycardic; S1 and S2 appreciated; no murmurs, no clicks, no rubs, no gallops RESP: Normal chest excursion without splinting or tachypnea; breath sounds clear and equal  bilaterally; no wheezes, no rhonchi, no rales, no hypoxia or respiratory distress, speaking full sentences ABD/GI: Normal bowel sounds; non-distended; soft, non-tender, no rebound, no guarding, no peritoneal signs, no hepatosplenomegaly BACK:  The back appears normal and is non-tender to palpation, there is no CVA tenderness EXT: Normal ROM in all joints;  non-tender to palpation; no edema; normal capillary refill; no cyanosis, no calf tenderness or swelling    SKIN: Normal color for age and race; warm; no rash NEURO: Moves all extremities equally PSYCH: The patient's mood and manner are appropriate. Grooming and personal hygiene are appropriate.  MEDICAL DECISION MAKING: Patient here with symptoms of viral pharyngitis.  Strep test is negative.  No signs of PTA, deep space neck infection, meningitis, pneumonia.  No sign of otitis media, otitis externa, mastoiditis.  Have offered her Decadron for symptomatic relief but she declines.  Recommended alternating Tylenol and Motrin, warm salt water gargle several times a day and over-the-counter medications for supportive treatment.  I do not feel she needs antibiotics at this time.   At this time, I do not feel there is any life-threatening condition present. I have reviewed and discussed all results (EKG, imaging, lab, urine as appropriate) and exam findings with patient/family. I have reviewed nursing notes and appropriate previous records.  I feel the patient is safe to be discharged home without further emergent workup and can continue workup as an outpatient as needed. Discussed usual and customary return precautions. Patient/family verbalize understanding and are comfortable with this plan.  Outpatient follow-up has been provided as needed. All questions have been answered.      Ward, Layla MawKristen N, DO 10/04/18 [phone]

## 2019-10-22 ENCOUNTER — Ambulatory Visit (HOSPITAL_COMMUNITY)
Admission: EM | Admit: 2019-10-22 | Discharge: 2019-10-22 | Disposition: A | Discharge status: Home / Self Care | Payer: 59 | Attending: Family Medicine | Admitting: Family Medicine

## 2019-10-22 ENCOUNTER — Other Ambulatory Visit: Payer: Self-pay

## 2019-10-22 ENCOUNTER — Encounter (HOSPITAL_COMMUNITY): Payer: Self-pay | Admitting: Emergency Medicine

## 2019-10-22 DIAGNOSIS — K0889 Other specified disorders of teeth and supporting structures: Secondary | ICD-10-CM

## 2019-10-22 DIAGNOSIS — R030 Elevated blood-pressure reading, without diagnosis of hypertension: Secondary | ICD-10-CM | POA: Diagnosis present

## 2019-10-22 DIAGNOSIS — Z113 Encounter for screening for infections with a predominantly sexual mode of transmission: Secondary | ICD-10-CM

## 2019-10-22 NOTE — Discharge Instructions (Addendum)
Your blood pressure was noted to be elevated during your visit today. You may return here within the next few days to recheck if unable to see your primary care doctor. If your blood pressure remains persistently elevated, you may need to begin taking a medication.  BP (!) 154/109   Pulse 97   Temp 98.2 F (36.8 C) (Oral)   Resp 16   LMP 10/08/2019   SpO2 100%   We have sent testing for sexually transmitted infections. We will notify you of any positive results once they are received. If required, we will prescribe any medications you might need.

## 2019-10-22 NOTE — ED Triage Notes (Signed)
PT reports issues with teeth that she would like to have looked at.  Would also like STD testing.

## 2019-10-23 NOTE — ED Provider Notes (Signed)
[place]   [id number] 10/22/19 Arrival Time: 9485  ASSESSMENT & PLAN:  1. Pain, dental   2. Elevated blood pressure reading without diagnosis of hypertension   3. Screening for STDs (sexually transmitted diseases)     No sign of abscess requiring I&D at this time. Discussed. Has appt with her dentist tomorrow. No signs of infection. Recommend topical Oragel as needed for localized discomfort.  Pending: Labs Reviewed  CERVICOVAGINAL ANCILLARY ONLY     Discharge Instructions     Your blood pressure was noted to be elevated during your visit today. You may return here within the next few days to recheck if unable to see your primary care doctor. If your blood pressure remains persistently elevated, you may need to begin taking a medication.  BP (!) 154/109   Pulse 97   Temp 98.2 F (36.8 C) (Oral)   Resp 16   LMP 10/08/2019   SpO2 100%   We have sent testing for sexually transmitted infections. We will notify you of any positive results once they are received. If required, we will prescribe any medications you might need.        Reviewed expectations re: course of current medical issues. Questions answered. Outlined signs and symptoms indicating need for more acute intervention. Patient verbalized understanding. After Visit Summary given.   SUBJECTIVE:  Kimberly Marquez is a 23 y.o. female who reports abrupt onset of left upper "tooth pain"; described as "soreness"; question abrasion with toothbrush or from eating hot foot yesterday. Fever: absent. Tolerating PO intake but reports some pain with chewing on left. Normal swallowing. She has a dental appt scheduled for tomorrow. No neck swelling or pain. No OTC tx reported.  Also requests STD testing. No symptoms. "Just want to be checked". Sexually active with one female partner.  Increased blood pressure noted today. Reports that she has not been treated for hypertension in the past.  She reports no  chest pain on exertion, no dyspnea on exertion, no swelling of ankles, no orthostatic dizziness or lightheadedness, no orthopnea or paroxysmal nocturnal dyspnea, no palpitations and no intermittent claudication symptoms. ROS: As per HPI.  OBJECTIVE: Vitals:   10/22/19 1720  BP: (!) 154/109  Pulse: 97  Resp: 16  Temp: 98.2 F (36.8 C)  TempSrc: Oral  SpO2: 100%    General appearance: alert; no distress HENT: normocephalic; atraumatic; dentition: good; left upper gums without areas of fluctuance, drainage, or bleeding and with mild tenderness to palpation; apparent small abrasion over left upper gum; no active bleeding; no areas of fluctuance; normal jaw movement without difficulty Neck: supple without LAD; FROM; trachea midline CV: regular Lungs: normal respirations; unlabored Abd; soft GU: deferred Skin: warm and dry Neuro: normal gait Ext: no edema Psychological: alert and cooperative; normal mood and affect  No Known Allergies  Past Medical History:  Diagnosis Date  . Asthma   . Bronchitis   . Eczema    Social History   Socioeconomic History  . Marital status: Married    Spouse name: Not on file  . Number of children: Not on file  . Years of education: Not on file  . Highest education level: Not on file  Occupational History  . Not on file  Tobacco Use  . Smoking status: Current Every Day Smoker    Packs/day: 0.10    Types: Cigarettes  . Smokeless tobacco: Never Used  Substance and Sexual Activity  . Alcohol use: Yes    Comment: rarely  .  Drug use: No  . Sexual activity: Not on file  Other Topics Concern  . Not on file  Social History Narrative  . Not on file   Social Determinants of Health   Financial Resource Strain:   . Difficulty of Paying Living Expenses: Not on file  Food Insecurity:   . Worried About Programme researcher, broadcasting/film/video in the Last Year: Not on file  . Ran Out of Food in the Last Year: Not on file  Transportation Needs:   . Lack of  Transportation (Medical): Not on file  . Lack of Transportation (Non-Medical): Not on file  Physical Activity:   . Days of Exercise per Week: Not on file  . Minutes of Exercise per Session: Not on file  Stress:   . Feeling of Stress : Not on file  Social Connections:   . Frequency of Communication with Friends and Family: Not on file  . Frequency of Social Gatherings with Friends and Family: Not on file  . Attends Religious Services: Not on file  . Active Member of Clubs or Organizations: Not on file  . Attends Banker Meetings: Not on file  . Marital Status: Not on file  Intimate Partner Violence:   . Fear of Current or Ex-Partner: Not on file  . Emotionally Abused: Not on file  . Physically Abused: Not on file  . Sexually Abused: Not on file   Family History  Problem Relation Age of Onset  . Cancer Mother   . Diabetes Other    History reviewed. No pertinent surgical history.   Mardella Layman, MD 10/23/19 1021

## 2019-10-28 ENCOUNTER — Encounter (HOSPITAL_COMMUNITY): Payer: Self-pay

## 2019-10-28 ENCOUNTER — Telehealth (HOSPITAL_COMMUNITY): Payer: Self-pay | Admitting: Emergency Medicine

## 2019-10-28 LAB — CERVICOVAGINAL ANCILLARY ONLY
Bacterial vaginitis: POSITIVE — AB
Candida vaginitis: NEGATIVE
Chlamydia: NEGATIVE
Neisseria Gonorrhea: NEGATIVE
Trichomonas: NEGATIVE

## 2019-10-28 MED ORDER — METRONIDAZOLE 500 MG PO TABS: 500.0000 mg | ORAL_TABLET | Freq: Two times a day (BID) | ORAL | 0 refills | Status: AC

## 2019-10-28 NOTE — Telephone Encounter (Signed)
Bacterial vaginosis is positive. This was not treated at the urgent care visit.  Flagyl 500 mg BID x 7 days #14 no refills sent to patients pharmacy of choice.    Number on file is wrong number. Sent [employer].

## 2020-01-03 ENCOUNTER — Other Ambulatory Visit: Payer: Self-pay

## 2020-01-03 ENCOUNTER — Ambulatory Visit (HOSPITAL_COMMUNITY)
Admission: EM | Admit: 2020-01-03 | Discharge: 2020-01-03 | Disposition: A | Discharge status: Home / Self Care | Payer: 59 | Attending: Internal Medicine | Admitting: Internal Medicine

## 2020-01-03 ENCOUNTER — Encounter (HOSPITAL_COMMUNITY): Payer: Self-pay | Admitting: Emergency Medicine

## 2020-01-03 DIAGNOSIS — J45909 Unspecified asthma, uncomplicated: Secondary | ICD-10-CM | POA: Diagnosis not present

## 2020-01-03 DIAGNOSIS — R197 Diarrhea, unspecified: Secondary | ICD-10-CM | POA: Diagnosis not present

## 2020-01-03 DIAGNOSIS — Z20822 Contact with and (suspected) exposure to covid-19: Secondary | ICD-10-CM | POA: Insufficient documentation

## 2020-01-03 DIAGNOSIS — R519 Headache, unspecified: Secondary | ICD-10-CM

## 2020-01-03 DIAGNOSIS — B349 Viral infection, unspecified: Secondary | ICD-10-CM | POA: Insufficient documentation

## 2020-01-03 DIAGNOSIS — F17210 Nicotine dependence, cigarettes, uncomplicated: Secondary | ICD-10-CM | POA: Insufficient documentation

## 2020-01-03 MED ORDER — IBUPROFEN 800 MG PO TABS: 800.0000 mg | ORAL_TABLET | Freq: Three times a day (TID) | ORAL | 0 refills | Status: AC

## 2020-01-03 MED ORDER — LOPERAMIDE HCL 2 MG PO CAPS: 2.0000 mg | ORAL_CAPSULE | Freq: Four times a day (QID) | ORAL | 0 refills | Status: AC | PRN

## 2020-01-03 NOTE — ED Provider Notes (Signed)
[facility]    CSN: [id number] Arrival date & time: 01/03/20  1817      History   Chief Complaint Chief Complaint  Patient presents with  . Headache    HPI Kimberly Marquez is a 24 y.o. female.   Patient presents for evaluation after workplace Covid exposure with onset of headache, body ache and diarrhea.  Symptoms been present since Wednesday.  Headache is frontal and present since Wednesday.  6-8 out of 10 in severity.  She has taken Tylenol and this is helped minimally.  She has reported episodes of diarrhea every 45 minutes starting yesterday.  It is watery.  Denies blood she denies any abdominal pain or cramping.  Denies nausea and vomiting.  Denies fever and chills but endorses body ache.  Denies runny nose, cough, congestion.  Denies chest pain or shortness of breath.     Past Medical History:  Diagnosis Date  . Asthma   . Bronchitis   . Eczema     Patient Active Problem List   Diagnosis Date Noted  . Overdose   . 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 No pertinent surgical history.  OB History   No obstetric history on file.      Home Medications    Prior to Admission medications   Medication Sig Start Date End Date Taking? Authorizing Provider  ibuprofen (ADVIL) 800 MG tablet Take 1 tablet (800 mg total) by mouth 3 (three) times daily. 01/03/20   Laycee Fitzsimmons, Veryl Speak, PA-C  loperamide (IMODIUM) 2 MG capsule Take 1 capsule (2 mg total) by mouth 4 (four) times daily as needed for diarrhea or loose stools. 01/03/20   Venola Castello, Veryl Speak, PA-C  Multiple Vitamins-Minerals (MULTIVITAL PO) Take 1 tablet by mouth daily.    [provider]  cetirizine (ZYRTEC ALLERGY) 10 MG tablet Take 1 tablet (10 mg total) by mouth daily. Patient not taking: Reported on 07/05/2018 06/28/17 10/22/19  Lurline Idol    Family History Family History  Problem Relation Age of Onset  . Cancer Mother   . Diabetes Other     Social  History Social History   Tobacco Use  . Smoking status: Current Every Day Smoker    Packs/day: 0.10    Types: Cigarettes  . Smokeless tobacco: Never Used  Substance Use Topics  . Alcohol use: Yes    Comment: rarely  . Drug use: No     Allergies   Patient has no known allergies.   Review of Systems Review of Systems  Constitutional: Negative for chills and fever.  HENT: Negative for congestion, ear pain, sinus pressure, sinus pain and sore throat.   Eyes: Negative for pain and visual disturbance.  Respiratory: Negative for cough and shortness of breath.   Cardiovascular: Negative for chest pain and palpitations.  Gastrointestinal: Positive for diarrhea. Negative for abdominal pain, anal bleeding, blood in stool, nausea and vomiting.  Genitourinary: Negative for dysuria and hematuria.  Musculoskeletal: Positive for myalgias. Negative for arthralgias and back pain.  Skin: Negative for color change and rash.  Neurological: Positive for headaches. Negative for seizures and syncope.  All other systems reviewed and are negative.    Physical Exam Triage Vital Signs ED Triage Vitals  Enc Vitals Group     BP 01/03/20 1840 (!) 159/110     Pulse Rate 01/03/20 1840 90     Resp 01/03/20 1840 16     Temp 01/03/20 1840 98.2 F (36.8  C)     Temp src --      SpO2 01/03/20 1840 100 %     Weight --      Height --      Head Circumference --      Peak Flow --      Pain Score 01/03/20 1841 7     Pain Loc --      Pain Edu? --      Excl. in GC? --    No data found.  Updated Vital Signs BP (!) 159/110   Pulse 90   Temp 98.2 F (36.8 C)   Resp 16   LMP 01/03/2020   SpO2 100%   Visual Acuity Right Eye Distance:   Left Eye Distance:   Bilateral Distance:    Right Eye Near:   Left Eye Near:    Bilateral Near:     Physical Exam Vitals and nursing note reviewed.  Constitutional:      General: She is not in acute distress.    Appearance: She is well-developed. She is not  ill-appearing.  HENT:     Head: Normocephalic and atraumatic.  Eyes:     Conjunctiva/sclera: Conjunctivae normal.  Cardiovascular:     Rate and Rhythm: Normal rate and regular rhythm.     Heart sounds: No murmur.  Pulmonary:     Effort: Pulmonary effort is normal. No respiratory distress.     Breath sounds: Normal breath sounds.  Abdominal:     Palpations: Abdomen is soft.     Tenderness: There is no abdominal tenderness.  Musculoskeletal:     Cervical back: Neck supple.  Skin:    General: Skin is warm and dry.  Neurological:     Mental Status: She is alert and oriented to person, place, and time.      UC Treatments / Results  Labs (all labs ordered are listed, but only abnormal results are displayed) Labs Reviewed  SARS CORONAVIRUS 2 (TAT 6-24 HRS)    EKG   Radiology No results found.  Procedures Procedures (including critical care time)  Medications Ordered in UC Medications - No data to display  Initial Impression / Assessment and Plan / UC Course  I have reviewed the triage vital signs and the nursing notes.  Pertinent labs & imaging results that were available during my care of the patient were reviewed by me and considered in my medical decision making (see chart for details).     #Viral illness #Headache Patient is 24 year old presenting with acute viral illness with headache.  Covid PCR was sent.  Discussed symptomatic management with Tylenol and ibuprofen for headache with accompanying Benadryl tonight.  Discussed starting loperamide if continued loose stools tomorrow.  Discussed return emergency department follow-up precautions patient expresses understanding and agreement with plan.  Final Clinical Impressions(s) / UC Diagnoses   Final diagnoses:  Acute nonintractable headache, unspecified headache type  Viral illness     Discharge Instructions     Take 1 800 mg ibuprofen, 2 extra strength Tylenol and 1-2 Benadryl tonight before you go to  bed.  You may continue take 1 ibuprofen or Tylenol for your headache  If you continue to have loose stools consider starting the loperamide as directed  Go directly to the Emergency Department or call 911 if you have severe chest pain, shortness of breath, severe diarrhea or feel as though you might pass out.    If your Covid-19 test is positive, you will receive a phone call from  [place] regarding your results. Negative test results are not called. Both positive and negative results area always visible on MyChart. If you do not have a MyChart account, sign up instructions are in your discharge papers.   Persons who are directed to care for themselves at home may discontinue isolation under the following conditions:  . At least 10 days have passed since symptom onset and . At least 24 hours have passed without running a fever (this means without the use of fever-reducing medications) and . Other symptoms have improved.  Persons infected with COVID-19 who never develop symptoms may discontinue isolation and other precautions 10 days after the date of their first positive COVID-19 test.     ED Prescriptions    Medication Sig Dispense Auth. Provider   ibuprofen (ADVIL) 800 MG tablet Take 1 tablet (800 mg total) by mouth 3 (three) times daily. 21 tablet Terah Robey, Marguerita Beards, PA-C   loperamide (IMODIUM) 2 MG capsule Take 1 capsule (2 mg total) by mouth 4 (four) times daily as needed for diarrhea or loose stools. 12 capsule Ervan Heber, Marguerita Beards, PA-C     PDMP not reviewed this encounter.   Purnell Shoemaker, PA-C 01/03/20 2046

## 2020-01-03 NOTE — Discharge Instructions (Signed)
Take 1 800 mg ibuprofen, 2 extra strength Tylenol and 1-2 Benadryl tonight before you go to bed.  You may continue take 1 ibuprofen or Tylenol for your headache  If you continue to have loose stools consider starting the loperamide as directed  Go directly to the Emergency Department or call 911 if you have severe chest pain, shortness of breath, severe diarrhea or feel as though you might pass out.    If your Covid-19 test is positive, you will receive a phone call from [facility] regarding your results. Negative test results are not called. Both positive and negative results area always visible on MyChart. If you do not have a MyChart account, sign up instructions are in your discharge papers.   Persons who are directed to care for themselves at home may discontinue isolation under the following conditions:   At least 10 days have passed since symptom onset and  At least 24 hours have passed without running a fever (this means without the use of fever-reducing medications) and  Other symptoms have improved.  Persons infected with COVID-19 who never develop symptoms may discontinue isolation and other precautions 10 days after the date of their first positive COVID-19 test.

## 2020-01-03 NOTE — ED Triage Notes (Signed)
Pt states last Saturday she was exposed to someone who had covid at her job. States for the last few days shes had body aches, diarrhea, HA.

## 2020-01-04 LAB — SARS CORONAVIRUS 2 (TAT 6-24 HRS): SARS Coronavirus 2: NEGATIVE
